# Patient Record
Sex: Female | Born: 1945 | Race: White | Hispanic: No | Smoking: Never smoker
Health system: Southern US, Community
[De-identification: ages and names within clinical notes are randomized; demographics above are authoritative.]

## PROBLEM LIST (undated history)

## (undated) DIAGNOSIS — E119 Type 2 diabetes mellitus without complications: Secondary | ICD-10-CM

## (undated) DIAGNOSIS — R739 Hyperglycemia, unspecified: Secondary | ICD-10-CM

## (undated) DIAGNOSIS — I10 Essential (primary) hypertension: Secondary | ICD-10-CM

## (undated) DIAGNOSIS — N390 Urinary tract infection, site not specified: Secondary | ICD-10-CM

## (undated) HISTORY — PX: CHOLECYSTECTOMY: SHX55

## (undated) HISTORY — PX: ABDOMINAL HYSTERECTOMY: SHX81

---

## 1997-03-23 ENCOUNTER — Ambulatory Visit (HOSPITAL_COMMUNITY): Admission: RE | Admit: 1997-03-23 | Discharge: 1997-03-23 | Payer: Self-pay | Admitting: Obstetrics and Gynecology

## 1997-07-10 ENCOUNTER — Other Ambulatory Visit: Admission: RE | Admit: 1997-07-10 | Discharge: 1997-07-10 | Payer: Self-pay | Admitting: Obstetrics and Gynecology

## 1998-06-07 ENCOUNTER — Ambulatory Visit (HOSPITAL_COMMUNITY): Admission: RE | Admit: 1998-06-07 | Discharge: 1998-06-07 | Payer: Self-pay | Admitting: Obstetrics and Gynecology

## 1998-06-07 ENCOUNTER — Encounter: Payer: Self-pay | Admitting: Obstetrics and Gynecology

## 1998-06-07 ENCOUNTER — Other Ambulatory Visit: Admission: RE | Admit: 1998-06-07 | Discharge: 1998-06-07 | Payer: Self-pay | Admitting: Obstetrics and Gynecology

## 1999-07-27 ENCOUNTER — Ambulatory Visit (HOSPITAL_COMMUNITY): Admission: RE | Admit: 1999-07-27 | Discharge: 1999-07-27 | Payer: Self-pay | Admitting: Obstetrics and Gynecology

## 1999-07-27 ENCOUNTER — Encounter: Payer: Self-pay | Admitting: Obstetrics and Gynecology

## 1999-07-28 ENCOUNTER — Other Ambulatory Visit: Admission: RE | Admit: 1999-07-28 | Discharge: 1999-07-28 | Payer: Self-pay | Admitting: *Deleted

## 2000-01-23 ENCOUNTER — Other Ambulatory Visit: Admission: RE | Admit: 2000-01-23 | Discharge: 2000-01-23 | Payer: Self-pay | Admitting: Obstetrics and Gynecology

## 2016-01-02 ENCOUNTER — Encounter (HOSPITAL_COMMUNITY): Payer: Self-pay | Admitting: Emergency Medicine

## 2016-01-02 ENCOUNTER — Inpatient Hospital Stay (HOSPITAL_COMMUNITY)
Admission: EM | Admit: 2016-01-02 | Discharge: 2016-01-09 | DRG: 690 | Disposition: A | Discharge status: Skilled Nursing Facility | Payer: Medicare Other | Attending: Internal Medicine | Admitting: Internal Medicine

## 2016-01-02 DIAGNOSIS — E1022 Type 1 diabetes mellitus with diabetic chronic kidney disease: Secondary | ICD-10-CM

## 2016-01-02 DIAGNOSIS — N390 Urinary tract infection, site not specified: Secondary | ICD-10-CM | POA: Diagnosis present

## 2016-01-02 DIAGNOSIS — E1122 Type 2 diabetes mellitus with diabetic chronic kidney disease: Secondary | ICD-10-CM | POA: Diagnosis present

## 2016-01-02 DIAGNOSIS — R079 Chest pain, unspecified: Secondary | ICD-10-CM

## 2016-01-02 DIAGNOSIS — E860 Dehydration: Secondary | ICD-10-CM | POA: Diagnosis present

## 2016-01-02 DIAGNOSIS — N189 Chronic kidney disease, unspecified: Secondary | ICD-10-CM | POA: Diagnosis present

## 2016-01-02 DIAGNOSIS — Z9049 Acquired absence of other specified parts of digestive tract: Secondary | ICD-10-CM

## 2016-01-02 DIAGNOSIS — R000 Tachycardia, unspecified: Secondary | ICD-10-CM

## 2016-01-02 DIAGNOSIS — M6282 Rhabdomyolysis: Secondary | ICD-10-CM | POA: Diagnosis present

## 2016-01-02 DIAGNOSIS — Z794 Long term (current) use of insulin: Secondary | ICD-10-CM

## 2016-01-02 DIAGNOSIS — Z7984 Long term (current) use of oral hypoglycemic drugs: Secondary | ICD-10-CM

## 2016-01-02 DIAGNOSIS — Z880 Allergy status to penicillin: Secondary | ICD-10-CM

## 2016-01-02 DIAGNOSIS — R197 Diarrhea, unspecified: Secondary | ICD-10-CM

## 2016-01-02 DIAGNOSIS — R778 Other specified abnormalities of plasma proteins: Secondary | ICD-10-CM

## 2016-01-02 DIAGNOSIS — E042 Nontoxic multinodular goiter: Secondary | ICD-10-CM | POA: Diagnosis present

## 2016-01-02 DIAGNOSIS — I129 Hypertensive chronic kidney disease with stage 1 through stage 4 chronic kidney disease, or unspecified chronic kidney disease: Secondary | ICD-10-CM | POA: Diagnosis present

## 2016-01-02 DIAGNOSIS — D649 Anemia, unspecified: Secondary | ICD-10-CM | POA: Diagnosis present

## 2016-01-02 DIAGNOSIS — R140 Abdominal distension (gaseous): Secondary | ICD-10-CM | POA: Diagnosis present

## 2016-01-02 DIAGNOSIS — R531 Weakness: Secondary | ICD-10-CM

## 2016-01-02 DIAGNOSIS — N179 Acute kidney failure, unspecified: Secondary | ICD-10-CM | POA: Diagnosis present

## 2016-01-02 DIAGNOSIS — R443 Hallucinations, unspecified: Secondary | ICD-10-CM | POA: Diagnosis present

## 2016-01-02 DIAGNOSIS — R7989 Other specified abnormal findings of blood chemistry: Secondary | ICD-10-CM

## 2016-01-02 DIAGNOSIS — Z79891 Long term (current) use of opiate analgesic: Secondary | ICD-10-CM

## 2016-01-02 DIAGNOSIS — I10 Essential (primary) hypertension: Secondary | ICD-10-CM

## 2016-01-02 DIAGNOSIS — I2510 Atherosclerotic heart disease of native coronary artery without angina pectoris: Secondary | ICD-10-CM | POA: Diagnosis present

## 2016-01-02 DIAGNOSIS — E1165 Type 2 diabetes mellitus with hyperglycemia: Secondary | ICD-10-CM

## 2016-01-02 HISTORY — DX: Type 2 diabetes mellitus without complications: E11.9

## 2016-01-02 HISTORY — DX: Essential (primary) hypertension: I10

## 2016-01-02 HISTORY — DX: Hyperglycemia, unspecified: R73.9

## 2016-01-02 HISTORY — DX: Urinary tract infection, site not specified: N39.0

## 2016-01-02 LAB — COMPREHENSIVE METABOLIC PANEL
ALBUMIN: 4 g/dL (ref 3.5–5.0)
ALT: 69 U/L — AB (ref 14–54)
AST: 61 U/L — AB (ref 15–41)
Alkaline Phosphatase: 59 U/L (ref 38–126)
Anion gap: 14 (ref 5–15)
BILIRUBIN TOTAL: 0.8 mg/dL (ref 0.3–1.2)
BUN: 23 mg/dL — AB (ref 6–20)
CHLORIDE: 99 mmol/L — AB (ref 101–111)
CO2: 24 mmol/L (ref 22–32)
CREATININE: 1.13 mg/dL — AB (ref 0.44–1.00)
Calcium: 9.5 mg/dL (ref 8.9–10.3)
GFR calc Af Amer: 56 mL/min — ABNORMAL LOW (ref >60)
GFR, EST NON AFRICAN AMERICAN: 48 mL/min — AB (ref >60)
GLUCOSE: 422 mg/dL — AB (ref 65–99)
POTASSIUM: 4 mmol/L (ref 3.5–5.1)
Sodium: 137 mmol/L (ref 135–145)
TOTAL PROTEIN: 8.1 g/dL (ref 6.5–8.1)

## 2016-01-02 LAB — URINALYSIS, ROUTINE W REFLEX MICROSCOPIC
BILIRUBIN URINE: NEGATIVE
KETONES UR: 15 mg/dL — AB
Nitrite: POSITIVE — AB
PH: 5 (ref 5.0–8.0)
PROTEIN: 30 mg/dL — AB
Specific Gravity, Urine: 1.031 — ABNORMAL HIGH (ref 1.005–1.030)

## 2016-01-02 LAB — CBC
HEMATOCRIT: 39 % (ref 36.0–46.0)
Hemoglobin: 12.9 g/dL (ref 12.0–15.0)
MCH: 27 pg (ref 26.0–34.0)
MCHC: 33.1 g/dL (ref 30.0–36.0)
MCV: 81.8 fL (ref 78.0–100.0)
PLATELETS: 283 10*3/uL (ref 150–400)
RBC: 4.77 MIL/uL (ref 3.87–5.11)
RDW: 15.4 % (ref 11.5–15.5)
WBC: 19.9 10*3/uL — AB (ref 4.0–10.5)

## 2016-01-02 LAB — URINE MICROSCOPIC-ADD ON: RBC / HPF: NONE SEEN RBC/hpf (ref 0–5)

## 2016-01-02 LAB — LIPASE, BLOOD: Lipase: 21 U/L (ref 11–51)

## 2016-01-02 LAB — CBG MONITORING, ED
GLUCOSE-CAPILLARY: 309 mg/dL — AB (ref 65–99)
Glucose-Capillary: 393 mg/dL — ABNORMAL HIGH (ref 65–99)

## 2016-01-02 MED ORDER — INSULIN GLARGINE 100 UNIT/ML ~~LOC~~ SOLN
50.0000 [IU] | Freq: Once | SUBCUTANEOUS | Status: AC
  Administered 2016-01-03: 50 [IU] via SUBCUTANEOUS
  Filled 2016-01-02 (×2): qty 0.5

## 2016-01-02 MED ORDER — SODIUM CHLORIDE 0.9 % IV BOLUS (SEPSIS)
1000.0000 mL | Freq: Once | INTRAVENOUS | Status: AC
  Administered 2016-01-02: 1000 mL via INTRAVENOUS

## 2016-01-02 MED ORDER — DEXTROSE 5 % IV SOLN
1.0000 g | Freq: Once | INTRAVENOUS | Status: AC
  Administered 2016-01-02: 1 g via INTRAVENOUS
  Filled 2016-01-02: qty 10

## 2016-01-02 MED ORDER — SODIUM CHLORIDE 0.9 % IV SOLN: Freq: Once | INTRAVENOUS | Status: DC

## 2016-01-02 NOTE — ED Triage Notes (Signed)
PT states she is staying with her daughter for a few days because her daughter has someone coming to her house to do work and wanted someone there with them.  Pt states she normally takes a shower but wanted to take a bath last night.  States she was stuck in bathtub for several hours once she let water out and didn't have the strength to get up because the positioning of the bathtub didn't allow her stronger side so she could get up.  States daughter had to help her get out of bathtub.  Today pt was unable to get off commode in same bathroom because of positioning.  States she was upset trying to get up and had episode of diarrhea that got all over her.  States once she was trying to get cleaned up she had another episode of bowel incontinence.  Pt reports generalized weakness.  Denies abd pain.

## 2016-01-02 NOTE — ED Notes (Signed)
Placed on  bedpan

## 2016-01-02 NOTE — ED Provider Notes (Signed)
[facility]-EMERGENCY DEPT Provider Note   CSN: [id number] Arrival date & time: 01/02/16  1705     History   Chief Complaint Chief Complaint  Patient presents with  . Diarrhea    HPI Anne Cannon is a 70 y.o. female.  Patient with history of uncontrolled diabetes, high blood pressure, cholecystectomy presents with general weakness and diarrhea. Since yesterday patient had recurrent diarrhea and worsening general weakness. Normally patient can get around with a cane however family went to pick her up for Thanksgiving break and they could not even lift her. Patient has no fever or abdominal pain. Mild cramping. No recent antibody X Spear patient vague with medical history. Patient coming down from [place]. No local physician.      Past Medical History:  Diagnosis Date  . Diabetes mellitus without complication (HCC)   . Hypertension     Patient Active Problem List   Diagnosis Date Noted  . UTI (urinary tract infection) 01/02/2016    Past Surgical History:  Procedure Laterality Date  . ABDOMINAL HYSTERECTOMY    . CHOLECYSTECTOMY      OB History    No data available       Home Medications    Prior to Admission medications   Medication Sig Start Date End Date Taking? Authorizing Provider  gabapentin (NEURONTIN) 100 MG capsule Take 100 mg by mouth at bedtime. 11/01/15  Yes Historical Provider, MD  LANTUS 100 UNIT/ML injection Inject 50 Units into the skin 2 (two) times daily. 12/29/15  Yes Historical Provider, MD  lisinopril (PRINIVIL,ZESTRIL) 10 MG tablet Take 10 mg by mouth daily. 12/02/15  Yes Historical Provider, MD  loratadine (CLARITIN) 10 MG tablet Take 10 mg by mouth daily.   Yes Historical Provider, MD  metFORMIN (GLUCOPHAGE) 1000 MG tablet Take 1,000 mg by mouth 2 (two) times daily. 12/22/15  Yes Historical Provider, MD  metoprolol succinate (TOPROL-XL) 100 MG 24 hr tablet Take 100 mg by mouth at bedtime.  12/02/15  Yes Historical Provider, MD    Family  History No family history on file.  Social History Social History  Substance Use Topics  . Smoking status: Never Smoker  . Smokeless tobacco: Never Used  . Alcohol use No     Allergies   Acetaminophen and Penicillins   Review of Systems Review of Systems  Constitutional: Positive for fatigue. Negative for chills and fever.  HENT: Negative for ear pain and sore throat.   Eyes: Negative for pain and visual disturbance.  Respiratory: Negative for cough and shortness of breath.   Cardiovascular: Negative for chest pain and palpitations.  Gastrointestinal: Positive for diarrhea and nausea. Negative for abdominal pain and vomiting.  Genitourinary: Negative for dysuria and hematuria.  Musculoskeletal: Negative for arthralgias and back pain.  Skin: Negative for color change and rash.  Neurological: Positive for weakness. Negative for seizures and syncope.  Psychiatric/Behavioral: Positive for hallucinations.  All other systems reviewed and are negative.    Physical Exam Updated Vital Signs BP 147/75   Pulse 113   Temp 98.2 F (36.8 C) (Oral)   Resp 21   Ht 5\' 4"  (1.626 m)   Wt 198 lb (89.8 kg)   SpO2 95%   BMI 33.99 kg/m   Physical Exam  Constitutional: She appears well-developed and well-nourished. No distress.  HENT:  Head: Normocephalic and atraumatic.  Dry mucous membranes  Eyes: Conjunctivae are normal.  Neck: Neck supple.  Cardiovascular: Regular rhythm.  Tachycardia present.   No murmur heard. Pulmonary/Chest: Effort normal  and breath sounds normal. No respiratory distress.  Abdominal: Soft. There is no tenderness.  Musculoskeletal: She exhibits no edema.  Neurological: She is alert.  Skin: Skin is warm and dry.  Psychiatric: She has a normal mood and affect.  Nursing note and vitals reviewed.    ED Treatments / Results  Labs (all labs ordered are listed, but only abnormal results are displayed) Labs Reviewed  COMPREHENSIVE METABOLIC PANEL -  Abnormal; Notable for the following:       Result Value   Chloride 99 (*)    Glucose, Bld 422 (*)    BUN 23 (*)    Creatinine, Ser 1.13 (*)    AST 61 (*)    ALT 69 (*)    GFR calc non Af Amer 48 (*)    GFR calc Af Amer 56 (*)    All other components within normal limits  CBC - Abnormal; Notable for the following:    WBC 19.9 (*)    All other components within normal limits  URINALYSIS, ROUTINE W REFLEX MICROSCOPIC (NOT AT [facility]) - Abnormal; Notable for the following:    APPearance CLOUDY (*)    Specific Gravity, Urine 1.031 (*)    Glucose, UA >1000 (*)    Hgb urine dipstick SMALL (*)    Ketones, ur 15 (*)    Protein, ur 30 (*)    Nitrite POSITIVE (*)    Leukocytes, UA SMALL (*)    All other components within normal limits  URINE MICROSCOPIC-ADD ON - Abnormal; Notable for the following:    Squamous Epithelial / LPF 6-30 (*)    Bacteria, UA MANY (*)    All other components within normal limits  CBG MONITORING, ED - Abnormal; Notable for the following:    Glucose-Capillary 393 (*)    All other components within normal limits  LIPASE, BLOOD    EKG  EKG Interpretation None       Radiology No results found.  Procedures Procedures (including critical care time)  Medications Ordered in ED Medications  insulin glargine (LANTUS) injection 50 Units (not administered)  0.9 %  sodium chloride infusion (not administered)  sodium chloride 0.9 % bolus 1,000 mL (0 mLs Intravenous Stopped 01/02/16 2030)  cefTRIAXone (ROCEPHIN) 1 g in dextrose 5 % 50 mL IVPB (0 g Intravenous Stopped 01/02/16 2147)     Initial Impression / Assessment and Plan / ED Course  I have reviewed the triage vital signs and the nursing notes.  Pertinent labs & imaging results that were available during my care of the patient were reviewed by me and considered in my medical decision making (see chart for details).  Clinical Course    Patient presents with clinical concern for dehydration, uncontrolled  diabetes which is led to a general weakness. Patient does have leukocytosis and is had mild hallucinations. Plan for urinalysis and admission. IV fluids ordered  The patients results and plan were reviewed and discussed.   Any x-rays performed were independently reviewed by myself.   Differential diagnosis were considered with the presenting HPI.  Medications  insulin glargine (LANTUS) injection 50 Units (not administered)  0.9 %  sodium chloride infusion (not administered)  sodium chloride 0.9 % bolus 1,000 mL (0 mLs Intravenous Stopped 01/02/16 2030)  cefTRIAXone (ROCEPHIN) 1 g in dextrose 5 % 50 mL IVPB (0 g Intravenous Stopped 01/02/16 2147)    Vitals:   01/02/16 2000 01/02/16 2045 01/02/16 2115 01/02/16 2145  BP: 141/73 158/95 169/67 147/75  Pulse: 115  116 115 113  Resp: 19 (!) 27 22 21   Temp:      TempSrc:      SpO2: 97% 96% 95% 95%  Weight:      Height:        Final diagnoses:  General weakness  Diarrhea, unspecified type  Dehydration  Urinary tract infection without hematuria, site unspecified    Admission/ observation were discussed with the admitting physician, patient and/or family and they are comfortable with the plan.    Final Clinical Impressions(s) / ED Diagnoses   Final diagnoses:  General weakness  Diarrhea, unspecified type  Dehydration  Urinary tract infection without hematuria, site unspecified    New Prescriptions New Prescriptions   No medications on file     Blane OharaJoshua Keena Dinse, MD 01/02/16 2231

## 2016-01-02 NOTE — ED Notes (Signed)
Report attempted 

## 2016-01-03 ENCOUNTER — Encounter (HOSPITAL_COMMUNITY): Payer: Self-pay | Admitting: Internal Medicine

## 2016-01-03 ENCOUNTER — Observation Stay (HOSPITAL_COMMUNITY): Payer: Medicare Other

## 2016-01-03 DIAGNOSIS — Z9049 Acquired absence of other specified parts of digestive tract: Secondary | ICD-10-CM | POA: Diagnosis not present

## 2016-01-03 DIAGNOSIS — N3000 Acute cystitis without hematuria: Secondary | ICD-10-CM | POA: Diagnosis not present

## 2016-01-03 DIAGNOSIS — R739 Hyperglycemia, unspecified: Secondary | ICD-10-CM

## 2016-01-03 DIAGNOSIS — E1165 Type 2 diabetes mellitus with hyperglycemia: Secondary | ICD-10-CM | POA: Diagnosis present

## 2016-01-03 DIAGNOSIS — M6282 Rhabdomyolysis: Secondary | ICD-10-CM | POA: Diagnosis present

## 2016-01-03 DIAGNOSIS — R531 Weakness: Secondary | ICD-10-CM | POA: Diagnosis not present

## 2016-01-03 DIAGNOSIS — R748 Abnormal levels of other serum enzymes: Secondary | ICD-10-CM | POA: Diagnosis not present

## 2016-01-03 DIAGNOSIS — R079 Chest pain, unspecified: Secondary | ICD-10-CM | POA: Diagnosis not present

## 2016-01-03 DIAGNOSIS — N390 Urinary tract infection, site not specified: Secondary | ICD-10-CM

## 2016-01-03 DIAGNOSIS — Z794 Long term (current) use of insulin: Secondary | ICD-10-CM | POA: Diagnosis not present

## 2016-01-03 DIAGNOSIS — I10 Essential (primary) hypertension: Secondary | ICD-10-CM | POA: Diagnosis not present

## 2016-01-03 DIAGNOSIS — E042 Nontoxic multinodular goiter: Secondary | ICD-10-CM | POA: Diagnosis present

## 2016-01-03 DIAGNOSIS — I2510 Atherosclerotic heart disease of native coronary artery without angina pectoris: Secondary | ICD-10-CM | POA: Diagnosis present

## 2016-01-03 DIAGNOSIS — Z7984 Long term (current) use of oral hypoglycemic drugs: Secondary | ICD-10-CM | POA: Diagnosis not present

## 2016-01-03 DIAGNOSIS — R197 Diarrhea, unspecified: Secondary | ICD-10-CM | POA: Diagnosis not present

## 2016-01-03 DIAGNOSIS — N189 Chronic kidney disease, unspecified: Secondary | ICD-10-CM | POA: Diagnosis present

## 2016-01-03 DIAGNOSIS — R140 Abdominal distension (gaseous): Secondary | ICD-10-CM | POA: Diagnosis present

## 2016-01-03 DIAGNOSIS — E860 Dehydration: Secondary | ICD-10-CM | POA: Diagnosis present

## 2016-01-03 DIAGNOSIS — R443 Hallucinations, unspecified: Secondary | ICD-10-CM | POA: Diagnosis present

## 2016-01-03 DIAGNOSIS — D649 Anemia, unspecified: Secondary | ICD-10-CM | POA: Diagnosis present

## 2016-01-03 DIAGNOSIS — N179 Acute kidney failure, unspecified: Secondary | ICD-10-CM | POA: Diagnosis present

## 2016-01-03 DIAGNOSIS — E1122 Type 2 diabetes mellitus with diabetic chronic kidney disease: Secondary | ICD-10-CM | POA: Diagnosis present

## 2016-01-03 DIAGNOSIS — I129 Hypertensive chronic kidney disease with stage 1 through stage 4 chronic kidney disease, or unspecified chronic kidney disease: Secondary | ICD-10-CM | POA: Diagnosis present

## 2016-01-03 DIAGNOSIS — Z79891 Long term (current) use of opiate analgesic: Secondary | ICD-10-CM | POA: Diagnosis not present

## 2016-01-03 DIAGNOSIS — E1022 Type 1 diabetes mellitus with diabetic chronic kidney disease: Secondary | ICD-10-CM | POA: Diagnosis not present

## 2016-01-03 DIAGNOSIS — Z880 Allergy status to penicillin: Secondary | ICD-10-CM | POA: Diagnosis not present

## 2016-01-03 HISTORY — DX: Urinary tract infection, site not specified: N39.0

## 2016-01-03 HISTORY — DX: Hyperglycemia, unspecified: R73.9

## 2016-01-03 LAB — CBC WITH DIFFERENTIAL/PLATELET
Basophils Absolute: 0 10*3/uL (ref 0.0–0.1)
Basophils Relative: 0 %
Eosinophils Absolute: 0.1 10*3/uL (ref 0.0–0.7)
Eosinophils Relative: 1 %
HEMATOCRIT: 34.6 % — AB (ref 36.0–46.0)
HEMOGLOBIN: 11 g/dL — AB (ref 12.0–15.0)
LYMPHS ABS: 3.7 10*3/uL (ref 0.7–4.0)
LYMPHS PCT: 23 %
MCH: 26.2 pg (ref 26.0–34.0)
MCHC: 31.8 g/dL (ref 30.0–36.0)
MCV: 82.4 fL (ref 78.0–100.0)
MONO ABS: 1.4 10*3/uL — AB (ref 0.1–1.0)
MONOS PCT: 9 %
NEUTROS ABS: 10.9 10*3/uL — AB (ref 1.7–7.7)
Neutrophils Relative %: 67 %
Platelets: 217 10*3/uL (ref 150–400)
RBC: 4.2 MIL/uL (ref 3.87–5.11)
RDW: 15.7 % — AB (ref 11.5–15.5)
WBC: 16.1 10*3/uL — ABNORMAL HIGH (ref 4.0–10.5)

## 2016-01-03 LAB — COMPREHENSIVE METABOLIC PANEL
ALBUMIN: 3.2 g/dL — AB (ref 3.5–5.0)
ALK PHOS: 47 U/L (ref 38–126)
ALT: 50 U/L (ref 14–54)
ANION GAP: 10 (ref 5–15)
AST: 47 U/L — ABNORMAL HIGH (ref 15–41)
BUN: 16 mg/dL (ref 6–20)
CHLORIDE: 103 mmol/L (ref 101–111)
CO2: 23 mmol/L (ref 22–32)
Calcium: 8.4 mg/dL — ABNORMAL LOW (ref 8.9–10.3)
Creatinine, Ser: 0.89 mg/dL (ref 0.44–1.00)
GFR calc Af Amer: >60 mL/min (ref >60)
GFR calc non Af Amer: >60 mL/min (ref >60)
GLUCOSE: 349 mg/dL — AB (ref 65–99)
POTASSIUM: 3.6 mmol/L (ref 3.5–5.1)
SODIUM: 136 mmol/L (ref 135–145)
Total Bilirubin: 0.6 mg/dL (ref 0.3–1.2)
Total Protein: 6.1 g/dL — ABNORMAL LOW (ref 6.5–8.1)

## 2016-01-03 LAB — GASTROINTESTINAL PANEL BY PCR, STOOL (REPLACES STOOL CULTURE)
Adenovirus F40/41: NOT DETECTED
Astrovirus: NOT DETECTED
Campylobacter species: NOT DETECTED
Cryptosporidium: NOT DETECTED
Cyclospora cayetanensis: NOT DETECTED
ENTEROAGGREGATIVE E COLI (EAEC): NOT DETECTED
ENTEROPATHOGENIC E COLI (EPEC): NOT DETECTED
ENTEROTOXIGENIC E COLI (ETEC): NOT DETECTED
Entamoeba histolytica: NOT DETECTED
GIARDIA LAMBLIA: NOT DETECTED
NOROVIRUS GI/GII: NOT DETECTED
Plesimonas shigelloides: NOT DETECTED
ROTAVIRUS A: NOT DETECTED
SALMONELLA SPECIES: NOT DETECTED
SHIGELLA/ENTEROINVASIVE E COLI (EIEC): NOT DETECTED
Sapovirus (I, II, IV, and V): NOT DETECTED
Shiga like toxin producing E coli (STEC): NOT DETECTED
Vibrio cholerae: NOT DETECTED
Vibrio species: NOT DETECTED
Yersinia enterocolitica: NOT DETECTED

## 2016-01-03 LAB — TROPONIN I
TROPONIN I: 0.1 ng/mL — AB (ref <0.03)
TROPONIN I: 0.05 ng/mL — AB (ref <0.03)
Troponin I: 0.09 ng/mL (ref <0.03)

## 2016-01-03 LAB — D-DIMER, QUANTITATIVE (NOT AT ARMC): D DIMER QUANT: 1.38 ug{FEU}/mL — AB (ref 0.00–0.50)

## 2016-01-03 LAB — GLUCOSE, CAPILLARY
GLUCOSE-CAPILLARY: 171 mg/dL — AB (ref 65–99)
GLUCOSE-CAPILLARY: 175 mg/dL — AB (ref 65–99)
Glucose-Capillary: 377 mg/dL — ABNORMAL HIGH (ref 65–99)
Glucose-Capillary: 308 mg/dL — ABNORMAL HIGH (ref 65–99)

## 2016-01-03 LAB — CK: Total CK: 1154 U/L — ABNORMAL HIGH (ref 38–234)

## 2016-01-03 MED ORDER — GABAPENTIN 100 MG PO CAPS
100.0000 mg | ORAL_CAPSULE | Freq: Every day | ORAL | Status: DC
  Administered 2016-01-03 – 2016-01-08 (×7): 100 mg via ORAL
  Filled 2016-01-03 (×7): qty 1

## 2016-01-03 MED ORDER — WHITE PETROLATUM GEL
Status: AC
  Administered 2016-01-03: 01:00:00
  Filled 2016-01-03: qty 1

## 2016-01-03 MED ORDER — METOPROLOL SUCCINATE ER 100 MG PO TB24
100.0000 mg | ORAL_TABLET | Freq: Every day | ORAL | Status: DC
  Administered 2016-01-03 – 2016-01-08 (×7): 100 mg via ORAL
  Filled 2016-01-03 (×7): qty 1

## 2016-01-03 MED ORDER — LORATADINE 10 MG PO TABS
10.0000 mg | ORAL_TABLET | Freq: Every day | ORAL | Status: DC
  Administered 2016-01-03 – 2016-01-09 (×7): 10 mg via ORAL
  Filled 2016-01-03 (×7): qty 1

## 2016-01-03 MED ORDER — ONDANSETRON HCL 4 MG PO TABS: 4.0000 mg | ORAL_TABLET | Freq: Four times a day (QID) | ORAL | Status: DC | PRN

## 2016-01-03 MED ORDER — ENOXAPARIN SODIUM 40 MG/0.4ML ~~LOC~~ SOLN
40.0000 mg | SUBCUTANEOUS | Status: DC
  Administered 2016-01-03 – 2016-01-09 (×7): 40 mg via SUBCUTANEOUS
  Filled 2016-01-03 (×8): qty 0.4

## 2016-01-03 MED ORDER — INSULIN GLARGINE 100 UNIT/ML ~~LOC~~ SOLN
50.0000 [IU] | Freq: Two times a day (BID) | SUBCUTANEOUS | Status: DC
  Administered 2016-01-04 – 2016-01-09 (×11): 50 [IU] via SUBCUTANEOUS
  Filled 2016-01-03 (×12): qty 0.5

## 2016-01-03 MED ORDER — SODIUM CHLORIDE 0.9 % IV SOLN
INTRAVENOUS | Status: AC
  Administered 2016-01-03: 01:00:00 via INTRAVENOUS

## 2016-01-03 MED ORDER — INSULIN ASPART 100 UNIT/ML ~~LOC~~ SOLN
0.0000 [IU] | Freq: Three times a day (TID) | SUBCUTANEOUS | Status: DC
  Administered 2016-01-03: 11 [IU] via SUBCUTANEOUS
  Administered 2016-01-03: 15 [IU] via SUBCUTANEOUS
  Administered 2016-01-03 – 2016-01-04 (×2): 3 [IU] via SUBCUTANEOUS
  Administered 2016-01-04 – 2016-01-05 (×3): 5 [IU] via SUBCUTANEOUS
  Administered 2016-01-05: 3 [IU] via SUBCUTANEOUS
  Administered 2016-01-06: 5 [IU] via SUBCUTANEOUS
  Administered 2016-01-06: 3 [IU] via SUBCUTANEOUS
  Administered 2016-01-07: 8 [IU] via SUBCUTANEOUS
  Administered 2016-01-07: 3 [IU] via SUBCUTANEOUS
  Administered 2016-01-07 – 2016-01-08 (×2): 8 [IU] via SUBCUTANEOUS
  Administered 2016-01-08: 5 [IU] via SUBCUTANEOUS
  Administered 2016-01-08: 3 [IU] via SUBCUTANEOUS

## 2016-01-03 MED ORDER — ONDANSETRON HCL 4 MG/2ML IJ SOLN: 4.0000 mg | Freq: Four times a day (QID) | INTRAMUSCULAR | Status: DC | PRN

## 2016-01-03 MED ORDER — IOPAMIDOL (ISOVUE-370) INJECTION 76%
INTRAVENOUS | Status: AC
  Administered 2016-01-03: 80 mL
  Filled 2016-01-03: qty 100

## 2016-01-03 MED ORDER — DEXTROSE 5 % IV SOLN
1.0000 g | INTRAVENOUS | Status: DC
  Administered 2016-01-03 – 2016-01-04 (×2): 1 g via INTRAVENOUS
  Filled 2016-01-03 (×3): qty 10

## 2016-01-03 MED ORDER — HYDRALAZINE HCL 20 MG/ML IJ SOLN
10.0000 mg | INTRAMUSCULAR | Status: DC | PRN
  Administered 2016-01-07: 10 mg via INTRAVENOUS
  Filled 2016-01-03 (×2): qty 1

## 2016-01-03 NOTE — Progress Notes (Signed)
Inpatient Diabetes Program Recommendations  AACE/ADA: New Consensus Statement on Inpatient Glycemic Control (2015)  Target Ranges:  Prepandial:   less than 140 mg/dL      Peak postprandial:   less than 180 mg/dL (1-2 hours)      Critically ill patients:  140 - 180 mg/dL   Lab Results  Component Value Date   GLUCAP 308 (H) 01/03/2016    Review of Glycemic Control:  Results for Anne Cannon, Anne Cannon (MRN [id number]) as of 01/03/2016 14:28  Ref. Range 01/02/2016 17:27 01/03/2016 07:07  Glucose Latest Ref Range: 65 - 99 mg/dL 147422 (H) 829349 (H)    Diabetes history: Type 2 diabetes  Outpatient Diabetes medications: Lantus 50 units bid Current orders for Inpatient glycemic control: Lantus 50 units bid, Novolog moderate tid with meals and HS  Inpatient Diabetes Program Recommendations:   Please check A1C to determine pre-hospitalization glycemic control.    Thanks, Beryl MeagerJenny Gamal Todisco, RN, BC-ADM Inpatient Diabetes Coordinator Pager [phone] (8a-5p)

## 2016-01-03 NOTE — Progress Notes (Signed)
Pharmacy Antibiotic Note  Anne HusbandsLinda Cannon is a 70 y.o. female admitted on 01/02/2016 with UTI.  Pharmacy has been consulted for Rocephin dosing.  Plan: Rocephin 1g IV Q24H.  Pharmacy will sign off.  Height: 5\' 4"  (162.6 cm) Weight: 190 lb 3.2 oz (86.3 kg) IBW/kg (Calculated) : 54.7  Temp (24hrs), Avg:98.3 F (36.8 C), Min:98.2 F (36.8 C), Max:98.3 F (36.8 C)   Recent Labs Lab 01/02/16 1727  WBC 19.9*  CREATININE 1.13*    Estimated Creatinine Clearance: 49.2 mL/min (by C-G formula based on SCr of 1.13 mg/dL (H)).    Allergies  Allergen Reactions  . Acetaminophen Other (See Comments)    epitaxis   . Penicillins Other (See Comments)    Stomach problems     Thank you for allowing pharmacy to be a part of this patient's care.  Vernard GamblesVeronda Rosalie Cannon, PharmD, BCPS  01/03/2016 12:18 AM

## 2016-01-03 NOTE — Consult Note (Signed)
CARDIOLOGY CONSULT NOTE  Patient ID: Anne Cannon MRN: [id number] DOB/AGE: June 10, 1945 70 y.o.  Admit date: 01/02/2016 Primary Physician :  None Primary Cardiologist New Chief Complaint  Elevated troponin Requesting  Dr. Caleb PoppNettey  HPI:   She was admitted with an episode of weakness. She seems somewhat confused.  She was staying with her daughter.  She tried to take a bath and could not get out of the tub.  She was apparently in the tub for hours and her daughter could not get her out and she did not want them to call 911.  On admission she was found to have an elevated CK.  Troponin has been mildly increased.  D dimer was negative but there was no evidence of PE on CT.    She is found to have a UTI. She reports a baseline weakness and falls.  She denies any chest pain as far as I can tell.  She seems to be somewhat limited and walks with a cane because of leg weakness.  The patient denies any new symptoms such as chest discomfort, neck or arm discomfort. There has been no new shortness of breath, PND or orthopnea. There have been no reported palpitations, presyncope or syncope.   Past Medical History:  Diagnosis Date  . Diabetes mellitus without complication (HCC)   . Hypertension     Past Surgical History:  Procedure Laterality Date  . ABDOMINAL HYSTERECTOMY    . CHOLECYSTECTOMY      Allergies  Allergen Reactions  . Acetaminophen Other (See Comments)    epitaxis   . Penicillins Other (See Comments)    Stomach problems   Prescriptions Prior to Admission  Medication Sig Dispense Refill Last Dose  . gabapentin (NEURONTIN) 100 MG capsule Take 100 mg by mouth at bedtime.   01/01/2016 at Unknown time  . LANTUS 100 UNIT/ML injection Inject 50 Units into the skin 2 (two) times daily.   01/01/2016 at Unknown time  . lisinopril (PRINIVIL,ZESTRIL) 10 MG tablet Take 10 mg by mouth daily.   01/01/2016 at Unknown time  . loratadine (CLARITIN) 10 MG tablet Take 10 mg by mouth daily.    01/01/2016 at Unknown time  . metFORMIN (GLUCOPHAGE) 1000 MG tablet Take 1,000 mg by mouth 2 (two) times daily.   01/02/2016 at Unknown time  . metoprolol succinate (TOPROL-XL) 100 MG 24 hr tablet Take 100 mg by mouth at bedtime.    01/01/2016 at 2100   Family History  Problem Relation Age of Onset  . Diabetes Daughter   . Heart disease Mother     No details  . Melanoma Father     Social History   Social History  . Marital status: Widowed    Spouse name: N/A  . Number of children: 2  . Years of education: N/A   Occupational History  . Not on file.   Social History Main Topics  . Smoking status: Never Smoker  . Smokeless tobacco: Never Used  . Alcohol use No  . Drug use: No  . Sexual activity: Not on file   Other Topics Concern  . Not on file   Social History Narrative  . No narrative on file     ROS:  She reports multiple falls, urinary urgency, fatigue.  Otherwise as stated in the HPI and negative for all other systems.  Physical Exam: Blood pressure (!) 141/61, pulse 86, temperature 98.4 F (36.9 C), temperature source Oral, resp. rate 18, height 5\' 4"  (1.626 m), weight  190 lb 3.2 oz (86.3 kg), SpO2 99 %.  GENERAL:  Frail appearing  HEENT:  Pupils equal round and reactive, fundi not visualized, oral mucosa unremarkable NECK:  No jugular venous distention, waveform within normal limits, carotid upstroke brisk and symmetric, no bruits, no thyromegaly LYMPHATICS:  No cervical, inguinal adenopathy LUNGS:  Clear to auscultation bilaterally BACK:  No CVA tenderness CHEST:  Unremarkable HEART:  PMI not displaced or sustained,S1 and S2 within normal limits, no S3, no S4, no clicks, no rubs, no murmurs ABD:  Flat, positive bowel sounds normal in frequency in pitch, no bruits, no rebound, no guarding, no midline pulsatile mass, no hepatomegaly, no splenomegaly EXT:  2 plus pulses throughout, no edema, no cyanosis no clubbing SKIN:  No rashes no nodules, bruises and knee  abrasions.  NEURO:  Cranial nerves II through XII grossly intact, motor grossly intact throughout PSYCH:   Confused    Labs: Lab Results  Component Value Date   BUN 16 01/03/2016   Lab Results  Component Value Date   CREATININE 0.89 01/03/2016   Lab Results  Component Value Date   NA 136 01/03/2016   K 3.6 01/03/2016   CL 103 01/03/2016   CO2 23 01/03/2016   Lab Results  Component Value Date   TROPONINI 0.05 (HH) 01/03/2016   Lab Results  Component Value Date   WBC 16.1 (H) 01/03/2016   HGB 11.0 (L) 01/03/2016   HCT 34.6 (L) 01/03/2016   MCV 82.4 01/03/2016   PLT 217 01/03/2016   No results found for: CHOL, HDL, LDLCALC, LDLDIRECT, TRIG, CHOLHDL Lab Results  Component Value Date   ALT 50 01/03/2016   AST 47 (H) 01/03/2016   ALKPHOS 47 01/03/2016   BILITOT 0.6 01/03/2016      Radiology:     CXR:  Trachea is midline. Heart size is accentuated by low lung volumes. Thoracic aorta is calcified. Lungs are clear. No pleural fluid. Osseous structures are grossly intact.  CT chest :   1.  No evidence of acute pulmonary embolus. 2. Mild atelectasis, no other pulmonary abnormality. 3. Calcified aortic and coronary artery atherosclerosis.  EKG:  Sinus tachycardia, rate 109, axis WNL, no acute ST T wave changes.  01/03/2016  ASSESSMENT AND PLAN:   ELEVATED TROPONIN:     Not sure the clinical context in which this was ordered.  She did have an elevated CK but there was no MB.  She denies any chest pain on my questioning.  She does have cardiovascular risk factors.  She clearly did not have LOC.  There are no acute EKG changes.  At this point I would suggest an echo.  If there are no wall motion abnormalities she could have a [employer] Myoview.  I will follow in the AM.   DIABETES:  I will defer to the primary team but would suggest an A1C.    HTN:  BP is fluctuating.  Continue current therapy.  Increase lisinopril if BP is still elevated.    SignedRollene Rotunda: Manvi Guilliams 01/03/2016, 5:46 PM

## 2016-01-03 NOTE — Progress Notes (Signed)
CRITICAL VALUE ALERT  Critical value received:  Trop 0.09  Date of notification:  01/03/2016   Time of notification:  1:49 AM   Critical value read back:Yes.    Nurse who received alert:  Cresenciano LickMikaela Jerelyn Trimarco   MD notified (1st page):  Toniann FailKakrakandy  Time of first page:  1:50 AM     Responding MD:  Toniann FailKakrakandy  Time MD responded:  1:50 AM

## 2016-01-03 NOTE — H&P (Signed)
History and Physical    Anne HusbandsLinda Cannon ZOX:[id number] DOB: December 16, 1945 DOA: 01/02/2016  PCP: No PCP Per Patient  Patient coming from: Home.  Chief Complaint: Weakness.  HPI: Anne HusbandsLinda Cannon is a 70 y.o. female with hypertension and diabetes mellitus2 had come to visit her daughter's house day before yesterday for Thanksgiving. While taking bath patient found it difficult to get out of the tub. Patient's daughter tried to help but was not able to. After many hours patient's son came and helped her out and was brought to the ER. During the process patient felt very weak and also had some chest discomfort. In the ER patient's labs reveal elevated creatinine and baseline creatinine is not known. UA shows features consistent with UTI. Patient also stated she had two episodes of watery diarrhea. Denies any recent use of antibiotics. Patient is being admitted for further management of her generalized weakness and UTI.   Patient's blood sugar was found to be elevated and patient states she has not received her Lantus dose. Patient also states though recently her blood sugar has been running high and difficult to control.  ED Course: Was started on ceftriaxone for UTI and IV fluids for weakness. Lantus 50 units subcutaneously was given in the ER which is her home dose.  Review of Systems: As per HPI, rest all negative.   Past Medical History:  Diagnosis Date  . Diabetes mellitus without complication (HCC)   . Hypertension     Past Surgical History:  Procedure Laterality Date  . ABDOMINAL HYSTERECTOMY    . CHOLECYSTECTOMY       reports that she has never smoked. She has never used smokeless tobacco. She reports that she does not drink alcohol or use drugs.  Allergies  Allergen Reactions  . Acetaminophen Other (See Comments)    epitaxis   . Penicillins Other (See Comments)    Stomach problems    Family History  Problem Relation Age of Onset  . Diabetes Daughter     Prior to Admission  medications   Medication Sig Start Date End Date Taking? Authorizing Provider  gabapentin (NEURONTIN) 100 MG capsule Take 100 mg by mouth at bedtime. 11/01/15  Yes Historical Provider, MD  LANTUS 100 UNIT/ML injection Inject 50 Units into the skin 2 (two) times daily. 12/29/15  Yes Historical Provider, MD  lisinopril (PRINIVIL,ZESTRIL) 10 MG tablet Take 10 mg by mouth daily. 12/02/15  Yes Historical Provider, MD  loratadine (CLARITIN) 10 MG tablet Take 10 mg by mouth daily.   Yes Historical Provider, MD  metFORMIN (GLUCOPHAGE) 1000 MG tablet Take 1,000 mg by mouth 2 (two) times daily. 12/22/15  Yes Historical Provider, MD  metoprolol succinate (TOPROL-XL) 100 MG 24 hr tablet Take 100 mg by mouth at bedtime.  12/02/15  Yes Historical Provider, MD    Physical Exam: Vitals:   01/02/16 2145 01/02/16 2230 01/02/16 2245 01/02/16 2347  BP: 147/75 156/73 161/74 133/89  Pulse: 113 116 118 (!) 113  Resp: 21 (!) 29 15 18   Temp:    98.3 F (36.8 C)  TempSrc:    Oral  SpO2: 95% 95% 95% 99%  Weight:    86.3 kg (190 lb 3.2 oz)  Height:          Constitutional: Moderately built and nourished. Vitals:   01/02/16 2145 01/02/16 2230 01/02/16 2245 01/02/16 2347  BP: 147/75 156/73 161/74 133/89  Pulse: 113 116 118 (!) 113  Resp: 21 (!) 29 15 18   Temp:  98.3 F (36.8 C)  TempSrc:    Oral  SpO2: 95% 95% 95% 99%  Weight:    86.3 kg (190 lb 3.2 oz)  Height:       Eyes: Anicteric. no pallor. ENMT: No discharge from the ears eyes nose or mouth. Neck: No mass felt. No neck rigidity. Respiratory: No rhonchi or crepitations. Cardiovascular: S1-S2 heard. No murmurs appreciated. Abdomen: Soft nontender bowel sounds present. No guarding or rigidity. Musculoskeletal: No edema. Skin: No rash. Skin appears warm. Neurologic: Alert awake oriented to time place and person. Moves all extremities. Psychiatric: Appears normal. Normal affect.   Labs on Admission: I have personally reviewed following labs and  imaging studies  CBC:  Recent Labs Lab 01/02/16 1727  WBC 19.9*  HGB 12.9  HCT 39.0  MCV 81.8  PLT 283   Basic Metabolic Panel:  Recent Labs Lab 01/02/16 1727  NA 137  K 4.0  CL 99*  CO2 24  GLUCOSE 422*  BUN 23*  CREATININE 1.13*  CALCIUM 9.5   GFR: Estimated Creatinine Clearance: 49.2 mL/min (by C-G formula based on SCr of 1.13 mg/dL (H)). Liver Function Tests:  Recent Labs Lab 01/02/16 1727  AST 61*  ALT 69*  ALKPHOS 59  BILITOT 0.8  PROT 8.1  ALBUMIN 4.0    Recent Labs Lab 01/02/16 1727  LIPASE 21   No results for input(s): AMMONIA in the last 168 hours. Coagulation Profile: No results for input(s): INR, PROTIME in the last 168 hours. Cardiac Enzymes: No results for input(s): CKTOTAL, CKMB, CKMBINDEX, TROPONINI in the last 168 hours. BNP (last 3 results) No results for input(s): PROBNP in the last 8760 hours. HbA1C: No results for input(s): HGBA1C in the last 72 hours. CBG:  Recent Labs Lab 01/02/16 1840 01/02/16 2248  GLUCAP 393* 309*   Lipid Profile: No results for input(s): CHOL, HDL, LDLCALC, TRIG, CHOLHDL, LDLDIRECT in the last 72 hours. Thyroid Function Tests: No results for input(s): TSH, T4TOTAL, FREET4, T3FREE, THYROIDAB in the last 72 hours. Anemia Panel: No results for input(s): VITAMINB12, FOLATE, FERRITIN, TIBC, IRON, RETICCTPCT in the last 72 hours. Urine analysis:    Component Value Date/Time   COLORURINE YELLOW 01/02/2016 1931   APPEARANCEUR CLOUDY (A) 01/02/2016 1931   LABSPEC 1.031 (H) 01/02/2016 1931   PHURINE 5.0 01/02/2016 1931   GLUCOSEU >1000 (A) 01/02/2016 1931   HGBUR SMALL (A) 01/02/2016 1931   BILIRUBINUR NEGATIVE 01/02/2016 1931   KETONESUR 15 (A) 01/02/2016 1931   PROTEINUR 30 (A) 01/02/2016 1931   NITRITE POSITIVE (A) 01/02/2016 1931   LEUKOCYTESUR SMALL (A) 01/02/2016 1931   Sepsis Labs: @LABRCNTIP (procalcitonin:4,lacticidven:4) )No results found for this or any previous visit (from the past 240  hour(s)).   Radiological Exams on Admission: No results found.  EKG: Independently reviewed. Sinus tachycardia with LVH. Nonspecific ST changes in inferior leads.  Assessment/Plan Principal Problem:   General weakness Active Problems:   Urinary tract infection without hematuria   Diarrhea   Dehydration   Controlled type 2 diabetes mellitus with hyperglycemia (HCC)   Weakness    1. Generalized weakness - could be multifactorial including UTI uncontrolled diabetes dehydration and diarrhea. At this time we will gently hydrate. Treat UTI and closely monitor CBGs and control blood glucose. Since patient was complaining of some chest discomfort which patient is not able to characterize we will cycle cardiac markers, d-dimer and 2-D echo. Physical therapy consult. 2. Chest discomfort - patient states yesterday while on the tub she did have some  chest discomfort. Unable to exactly characterize the discomfort. We will cycle cardiac markers, check 2-D echo and d-dimer and chest x-ray. 3. Hypertension - since creatinine is mildly elevated and we do not know the baseline creatinine we will hold lisinopril but continue metoprolol. When necessary IV hydralazine for systolic blood pressure more than 160. 4. Diabetes mellitus type 2 uncontrolled - patient has missed her home dose of Lantus. One dose has been given to the ER. Closely follow metabolic panel for any developing DKA. I have placed patient on moderate dose sliding scale coverage. 5. Diarrhea - check stool studies. Patient denies being on any antibiotics recently. 6. UTI - patient is placed on ceftriaxone. Check urine cultures. 7. Acute renal failure - baseline creatinine not known. For now I would hold off lisinopril and gently hydrate. Follow metabolic panel.   DVT prophylaxis: Lovenox. Code Status: Full code.  Family Communication: Discussed with patient.  Disposition Plan: To be determined.  Consults called: Physical therapy.  Admission  status: Observation.    Eduard Clos MD [facility] Pager [phone].  If 7PM-7AM, please contact night-coverage [email or web address] Password [email or web address]  01/03/2016, 12:17 AM

## 2016-01-04 ENCOUNTER — Inpatient Hospital Stay (HOSPITAL_COMMUNITY): Payer: Medicare Other

## 2016-01-04 DIAGNOSIS — R079 Chest pain, unspecified: Secondary | ICD-10-CM

## 2016-01-04 DIAGNOSIS — E860 Dehydration: Secondary | ICD-10-CM

## 2016-01-04 DIAGNOSIS — M6282 Rhabdomyolysis: Secondary | ICD-10-CM

## 2016-01-04 LAB — GLUCOSE, CAPILLARY
GLUCOSE-CAPILLARY: 185 mg/dL — AB (ref 65–99)
GLUCOSE-CAPILLARY: 191 mg/dL — AB (ref 65–99)
Glucose-Capillary: 244 mg/dL — ABNORMAL HIGH (ref 65–99)
Glucose-Capillary: 224 mg/dL — ABNORMAL HIGH (ref 65–99)

## 2016-01-04 LAB — CBC
HCT: 34.8 % — ABNORMAL LOW (ref 36.0–46.0)
Hemoglobin: 11.1 g/dL — ABNORMAL LOW (ref 12.0–15.0)
MCH: 26.5 pg (ref 26.0–34.0)
MCHC: 31.9 g/dL (ref 30.0–36.0)
MCV: 83.1 fL (ref 78.0–100.0)
PLATELETS: 217 10*3/uL (ref 150–400)
RBC: 4.19 MIL/uL (ref 3.87–5.11)
RDW: 15.5 % (ref 11.5–15.5)
WBC: 12.6 10*3/uL — ABNORMAL HIGH (ref 4.0–10.5)

## 2016-01-04 LAB — ECHOCARDIOGRAM COMPLETE
Height: 64 in
Weight: 3155.2 oz

## 2016-01-04 LAB — C DIFFICILE QUICK SCREEN W PCR REFLEX
C DIFFICILE (CDIFF) INTERP: NOT DETECTED
C Diff antigen: NEGATIVE
C Diff toxin: NEGATIVE

## 2016-01-04 MED ORDER — LISINOPRIL 10 MG PO TABS
10.0000 mg | ORAL_TABLET | Freq: Every day | ORAL | Status: DC
  Administered 2016-01-04 – 2016-01-05 (×2): 10 mg via ORAL
  Filled 2016-01-04 (×2): qty 1

## 2016-01-04 MED ORDER — PERFLUTREN LIPID MICROSPHERE
1.0000 mL | INTRAVENOUS | Status: AC | PRN
  Administered 2016-01-04: 2 mL via INTRAVENOUS
  Filled 2016-01-04: qty 10

## 2016-01-04 MED ORDER — GI COCKTAIL ~~LOC~~
30.0000 mL | Freq: Once | ORAL | Status: AC
  Administered 2016-01-04: 30 mL via ORAL
  Filled 2016-01-04: qty 30

## 2016-01-04 MED ORDER — ZOLPIDEM TARTRATE 5 MG PO TABS
5.0000 mg | ORAL_TABLET | Freq: Once | ORAL | Status: DC
  Filled 2016-01-04 (×3): qty 1

## 2016-01-04 NOTE — Progress Notes (Signed)
PROGRESS NOTE    Anne Cannon  RUE:[id number] DOB: 1946-01-08 DOA: 01/02/2016 PCP: No PCP Per Patient   Brief Narrative: Anne Cannon is a 70 y.o. female with hypertension and diabetes mellitus 2. She presented with weakness and had a UA suggestive of UTI (with symptoms) and associated elevated troponin. Cardiology following. Symptoms improving.   Assessment & Plan:   Principal Problem:   General weakness Active Problems:   Urinary tract infection without hematuria   Diarrhea   Dehydration   Controlled type 2 diabetes mellitus with hyperglycemia (HCC)   Weakness   Generalized weakness Improved today. Suspect secondary to likely rhabdomyolysis. Could consider more rheumatologic etiology, such as PMR. Symptoms improved without steroids, however. -encourage good oral intake -echo pending -will consult PT pending results of echo -consider rheumatologic workup as outpatient if this becomes a recurring issue  Chest discomfort Unknown etiology. Patient had elevated troponin which was mild, and has already trended down. Still has some mild discomfort in epigastric area. -echo pending -cardiology recommendations -will give a GI cocktail to see if any improvement  Hypertension Blood pressure elevated. Lisinopril held on admission secondary to possible AKI. AKI resolved. -restart lisinopril -continue metoprolol -continue hydralazine 10mg  q4hrs prn  Diabetes mellitus, type 2 No charted A1C -continue sliding scale insulin -obtain A1C  Diarrhea C. Difficile and GI pathogen panel negative. Patient having stools but is unsure if loose or watery. Charted stools do not have characteristics listed  UTI Possibly contributing to presentation -continue ceftriaxone -will obtain culture (s/p antibiotics)  AKI Resolved.   DVT prophylaxis: Lovenox Code Status: Full code Family Communication: none at bedside Disposition Plan: Pending possible cardiac workup   Consultants:    Cardiology  Procedures:   None  Antimicrobials:  Ceftriaxone (11/20>>    Subjective: Patient reports feeling better today. She can move her legs and shoulders better.  Objective: Vitals:   01/03/16 1610 01/03/16 1613 01/03/16 2100 01/04/16 0623  BP: (!) 136/110 (!) 141/61 (!) 141/50 (!) 174/75  Pulse: 86 88 81 87  Resp:  18 18 18   Temp: 98.4 F (36.9 C) 98.5 F (36.9 C) (!) 94.4 F (34.7 C) 98.6 F (37 C)  TempSrc: Oral Oral Oral Oral  SpO2: 99% 99% 97% 97%  Weight:    89.4 kg (197 lb 3.2 oz)  Height:        Intake/Output Summary (Last 24 hours) at 01/04/16 0641 Last data filed at 01/03/16 1900  Gross per 24 hour  Intake             1755 ml  Output                0 ml  Net             1755 ml   Filed Weights   01/02/16 1723 01/02/16 2347 01/04/16 0623  Weight: 89.8 kg (198 lb) 86.3 kg (190 lb 3.2 oz) 89.4 kg (197 lb 3.2 oz)    Examination:  General exam: Appears calm and comfortable Respiratory system: Clear to auscultation. Respiratory effort normal. Cardiovascular system: S1 & S2 heard, RRR. No murmurs, rubs, gallops or clicks. Gastrointestinal system: Abdomen is nondistended, soft and nontender. Normal bowel sounds heard. Central nervous system: Alert and oriented. No focal neurological deficits. Extremities: No edema. No calf tenderness Skin: No cyanosis. No rashes Psychiatry: Judgement and insight appear normal. Mood & affect appropriate.     Data Reviewed: I have personally reviewed following labs and imaging studies  CBC:  Recent Labs Lab  01/02/16 1727 01/03/16 0707  WBC 19.9* 16.1*  NEUTROABS  --  10.9*  HGB 12.9 11.0*  HCT 39.0 34.6*  MCV 81.8 82.4  PLT 283 217   Basic Metabolic Panel:  Recent Labs Lab 01/02/16 1727 01/03/16 0707  NA 137 136  K 4.0 3.6  CL 99* 103  CO2 24 23  GLUCOSE 422* 349*  BUN 23* 16  CREATININE 1.13* 0.89  CALCIUM 9.5 8.4*   GFR: Estimated Creatinine Clearance: 63.7 mL/min (by C-G formula based  on SCr of 0.89 mg/dL). Liver Function Tests:  Recent Labs Lab 01/02/16 1727 01/03/16 0707  AST 61* 47*  ALT 69* 50  ALKPHOS 59 47  BILITOT 0.8 0.6  PROT 8.1 6.1*  ALBUMIN 4.0 3.2*    Recent Labs Lab 01/02/16 1727  LIPASE 21   No results for input(s): AMMONIA in the last 168 hours. Coagulation Profile: No results for input(s): INR, PROTIME in the last 168 hours. Cardiac Enzymes:  Recent Labs Lab 01/03/16 0044 01/03/16 0707 01/03/16 1157  CKTOTAL  --  1,154*  --   TROPONINI 0.09* 0.10* 0.05*   BNP (last 3 results) No results for input(s): PROBNP in the last 8760 hours. HbA1C: No results for input(s): HGBA1C in the last 72 hours. CBG:  Recent Labs Lab 01/02/16 2248 01/03/16 0624 01/03/16 1146 01/03/16 1653 01/03/16 2117  GLUCAP 309* 377* 308* 171* 175*   Lipid Profile: No results for input(s): CHOL, HDL, LDLCALC, TRIG, CHOLHDL, LDLDIRECT in the last 72 hours. Thyroid Function Tests: No results for input(s): TSH, T4TOTAL, FREET4, T3FREE, THYROIDAB in the last 72 hours. Anemia Panel: No results for input(s): VITAMINB12, FOLATE, FERRITIN, TIBC, IRON, RETICCTPCT in the last 72 hours. Sepsis Labs: No results for input(s): PROCALCITON, LATICACIDVEN in the last 168 hours.  Recent Results (from the past 240 hour(s))  Gastrointestinal Panel by PCR , Stool     Status: None   Collection Time: 01/03/16 12:15 AM  Result Value Ref Range Status   Campylobacter species NOT DETECTED NOT DETECTED Final   Plesimonas shigelloides NOT DETECTED NOT DETECTED Final   Salmonella species NOT DETECTED NOT DETECTED Final   Yersinia enterocolitica NOT DETECTED NOT DETECTED Final   Vibrio species NOT DETECTED NOT DETECTED Final   Vibrio cholerae NOT DETECTED NOT DETECTED Final   Enteroaggregative E coli (EAEC) NOT DETECTED NOT DETECTED Final   Enteropathogenic E coli (EPEC) NOT DETECTED NOT DETECTED Final   Enterotoxigenic E coli (ETEC) NOT DETECTED NOT DETECTED Final   Shiga  like toxin producing E coli (STEC) NOT DETECTED NOT DETECTED Final   Shigella/Enteroinvasive E coli (EIEC) NOT DETECTED NOT DETECTED Final   Cryptosporidium NOT DETECTED NOT DETECTED Final   Cyclospora cayetanensis NOT DETECTED NOT DETECTED Final   Entamoeba histolytica NOT DETECTED NOT DETECTED Final   Giardia lamblia NOT DETECTED NOT DETECTED Final   Adenovirus F40/41 NOT DETECTED NOT DETECTED Final   Astrovirus NOT DETECTED NOT DETECTED Final   Norovirus GI/GII NOT DETECTED NOT DETECTED Final   Rotavirus A NOT DETECTED NOT DETECTED Final   Sapovirus (I, II, IV, and V) NOT DETECTED NOT DETECTED Final         Radiology Studies: Ct Angio Chest Pe W Or Wo Contrast  Result Date: 01/03/2016 CLINICAL DATA:  70 year old female with chest pain and tachycardia. Initial encounter. EXAM: CT ANGIOGRAPHY CHEST WITH CONTRAST TECHNIQUE: Multidetector CT imaging of the chest was performed using the standard protocol during bolus administration of intravenous contrast. Multiplanar CT image reconstructions and MIPs  were obtained to evaluate the vascular anatomy. CONTRAST:  80 mL Isovue 370 COMPARISON:  Portable chest radiograph 0714 hours today. FINDINGS: Cardiovascular: Adequate contrast bolus timing in the pulmonary arterial tree. Mild respiratory motion at the lung bases. No focal filling defect identified in the pulmonary arteries to suggest acute pulmonary embolism. Mild cardiomegaly. No pericardial effusion. Mild Calcified aortic atherosclerosis. Calcified coronary artery atherosclerosis. Mediastinum/Nodes: No lymphadenopathy. Calcified thyroid nodules incidentally noted at the thoracic inlet, do not appear to meet size criteria for ultrasound follow-up. Lungs/Pleura: Major airways are patent. Somewhat low lung volumes with dependent atelectasis in both lungs. Mild scarring or atelectasis in the lingula. No other abnormal pulmonary opacity. No pleural effusions. Upper Abdomen: Negative visualized liver,  spleen, pancreas, adrenal glands, and stomach in the upper abdomen. Musculoskeletal: No acute osseous abnormality identified. Review of the MIP images confirms the above findings. IMPRESSION: 1.  No evidence of acute pulmonary embolus. 2. Mild atelectasis, no other pulmonary abnormality. 3. Calcified aortic and coronary artery atherosclerosis. Electronically Signed   By: Odessa FlemingH  Hall M.D.   On: 01/03/2016 15:13   Dg Chest Port 1 View  Result Date: 01/03/2016 CLINICAL DATA:  Recent fall, soreness, initial encounter. EXAM: PORTABLE CHEST 1 VIEW COMPARISON:  None. FINDINGS: Trachea is midline. Heart size is accentuated by low lung volumes. Thoracic aorta is calcified. Lungs are clear. No pleural fluid. Osseous structures are grossly intact. IMPRESSION: Low lung volumes.  No acute findings. Electronically Signed   By: Leanna BattlesMelinda  Blietz M.D.   On: 01/03/2016 07:51        Scheduled Meds: . cefTRIAXone (ROCEPHIN)  IV  1 g Intravenous Q24H  . enoxaparin (LOVENOX) injection  40 mg Subcutaneous Q24H  . gabapentin  100 mg Oral QHS  . insulin aspart  0-15 Units Subcutaneous TID WC  . insulin glargine  50 Units Subcutaneous BID  . loratadine  10 mg Oral Daily  . metoprolol succinate  100 mg Oral QHS   Continuous Infusions:   LOS: 1 day     Jacquelin HawkingRalph Akeylah Hendel [facility] 01/04/2016, 6:41 AM Pager: [phone]  If 7PM-7AM, please contact night-coverage [email or web address] Password [email or web address] 01/04/2016, 6:41 AM

## 2016-01-04 NOTE — Progress Notes (Signed)
Inpatient Diabetes Program Recommendations  AACE/ADA: New Consensus Statement on Inpatient Glycemic Control (2015)  Target Ranges:  Prepandial:   less than 140 mg/dL      Peak postprandial:   less than 180 mg/dL (1-2 hours)      Critically ill patients:  140 - 180 mg/dL   Lab Results  Component Value Date   GLUCAP 185 (H) 01/04/2016    Review of Glycemic Control Results for Anne Cannon, Anne Cannon (MRN [id number]) as of 01/04/2016 08:53  Ref. Range 01/03/2016 06:24 01/03/2016 11:46 01/03/2016 16:53 01/03/2016 21:17 01/04/2016 06:58  Glucose-Capillary Latest Ref Range: 65 - 99 mg/dL 756377 (H) 433308 (H) 295171 (H) 175 (H) 185 (H)   Diabetes history: Type 2 diabetes  Outpatient Diabetes medications: Lantus 50 units bid Current orders for Inpatient glycemic control: Lantus 50 units bid, Novolog moderate tid with meals and HS  Inpatient Diabetes Program Recommendations:  Consider adding Novolog 2 units mealtime insulin.  Susette RacerJulie Alexsandra Shontz, RN, BA, MHA, CDE Diabetes Coordinator Inpatient Diabetes Program  [phone] (Team [phone] [facility] Office) 01/04/2016 8:56 AM

## 2016-01-04 NOTE — Progress Notes (Signed)
  Echocardiogram 2D Echocardiogram with Definity has been performed.  Cathie BeamsGREGORY, Larayne Baxley 01/04/2016, 3:28 PM

## 2016-01-04 NOTE — Progress Notes (Signed)
         Chart reviewed.  No change from suggestions of last evening.  I am awaiting the echo to decide on further imaging.

## 2016-01-05 ENCOUNTER — Encounter (HOSPITAL_COMMUNITY): Payer: Self-pay | Admitting: General Practice

## 2016-01-05 DIAGNOSIS — R079 Chest pain, unspecified: Secondary | ICD-10-CM

## 2016-01-05 DIAGNOSIS — R197 Diarrhea, unspecified: Secondary | ICD-10-CM

## 2016-01-05 DIAGNOSIS — N390 Urinary tract infection, site not specified: Principal | ICD-10-CM

## 2016-01-05 LAB — GLUCOSE, CAPILLARY
GLUCOSE-CAPILLARY: 113 mg/dL — AB (ref 65–99)
GLUCOSE-CAPILLARY: 192 mg/dL — AB (ref 65–99)
Glucose-Capillary: 206 mg/dL — ABNORMAL HIGH (ref 65–99)
Glucose-Capillary: 218 mg/dL — ABNORMAL HIGH (ref 65–99)

## 2016-01-05 LAB — CK: CK TOTAL: 274 U/L — AB (ref 38–234)

## 2016-01-05 LAB — HEMOGLOBIN A1C
HEMOGLOBIN A1C: 10 % — AB (ref 4.8–5.6)
Mean Plasma Glucose: 240 mg/dL

## 2016-01-05 MED ORDER — LOPERAMIDE HCL 2 MG PO CAPS
2.0000 mg | ORAL_CAPSULE | Freq: Two times a day (BID) | ORAL | Status: DC | PRN
  Administered 2016-01-08 – 2016-01-09 (×2): 2 mg via ORAL
  Filled 2016-01-05 (×2): qty 1

## 2016-01-05 NOTE — Progress Notes (Signed)
Patient Name: Anne HusbandsLinda Tischer Date of Encounter: 01/05/2016  Primary Cardiologist:    Dr. Antoine PocheHochrein (new)  Hospital Problem List     Principal Problem:   General weakness Active Problems:   Urinary tract infection without hematuria   Diarrhea   Dehydration   Controlled type 2 diabetes mellitus with hyperglycemia (HCC)   Weakness     Subjective   Denies chest pain.  Chronic difficulty with deep breaths.  Inpatient Medications    Scheduled Meds: . cefTRIAXone (ROCEPHIN)  IV  1 g Intravenous Q24H  . enoxaparin (LOVENOX) injection  40 mg Subcutaneous Q24H  . gabapentin  100 mg Oral QHS  . insulin aspart  0-15 Units Subcutaneous TID WC  . insulin glargine  50 Units Subcutaneous BID  . lisinopril  10 mg Oral Daily  . loratadine  10 mg Oral Daily  . metoprolol succinate  100 mg Oral QHS  . zolpidem  5 mg Oral Once   Continuous Infusions:  PRN Meds: hydrALAZINE, ondansetron **OR** ondansetron (ZOFRAN) IV   Vital Signs    Vitals:   01/04/16 1500 01/04/16 2100 01/05/16 0500 01/05/16 0909  BP: (!) 163/70 (!) 185/74 (!) 191/71 (!) 151/79  Pulse: 88 84 77 88  Resp: 18 18 17 18   Temp: 98.4 F (36.9 C) 97.9 F (36.6 C) 97.7 F (36.5 C) 97.8 F (36.6 C)  TempSrc: Oral Oral Oral Oral  SpO2: 98% 99% 97% 99%  Weight:      Height:        Intake/Output Summary (Last 24 hours) at 01/05/16 1011 Last data filed at 01/05/16 0900  Gross per 24 hour  Intake              700 ml  Output              800 ml  Net             -100 ml   Filed Weights   01/02/16 1723 01/02/16 2347 01/04/16 0623  Weight: 198 lb (89.8 kg) 190 lb 3.2 oz (86.3 kg) 197 lb 3.2 oz (89.4 kg)    Physical Exam    GEN: NAD.  Neck:  no JVD Cardiac: Regular Rate and Rhythm, nomurmurs, rubs, or gallops.  noedema.  Radials/DP/PT 2+  and equal bilaterally.  Respiratory:  Respirations  regular and unlabored, clear to auscultation bilaterally. GI: Soft, nontender, nondistended, BS + x 4. Skin: warm and dry,  no rash. Neuro:   Strength and sensation are intact. Psych:  AAOx3.  Normal affect.  Labs    CBC  Recent Labs  01/03/16 0707 01/04/16 0859  WBC 16.1* 12.6*  NEUTROABS 10.9*  --   HGB 11.0* 11.1*  HCT 34.6* 34.8*  MCV 82.4 83.1  PLT 217 217   Basic Metabolic Panel  Recent Labs  01/02/16 1727 01/03/16 0707  NA 137 136  K 4.0 3.6  CL 99* 103  CO2 24 23  GLUCOSE 422* 349*  BUN 23* 16  CREATININE 1.13* 0.89  CALCIUM 9.5 8.4*   Liver Function Tests  Recent Labs  01/02/16 1727 01/03/16 0707  AST 61* 47*  ALT 69* 50  ALKPHOS 59 47  BILITOT 0.8 0.6  PROT 8.1 6.1*  ALBUMIN 4.0 3.2*    Recent Labs  01/02/16 1727  LIPASE 21   Cardiac Enzymes  Recent Labs  01/03/16 0044 01/03/16 0707 01/03/16 1157 01/05/16 0716  CKTOTAL  --  1,154*  --  274*  TROPONINI 0.09* 0.10* 0.05*  --  BNP Invalid input(s): POCBNP D-Dimer  Recent Labs  01/03/16 0707  DDIMER 1.38*   Hemoglobin A1C  Recent Labs  01/04/16 0859  HGBA1C 10.0*   Fasting Lipid Panel No results for input(s): CHOL, HDL, LDLCALC, TRIG, CHOLHDL, LDLDIRECT in the last 72 hours. Thyroid Function Tests No results for input(s): TSH, T4TOTAL, T3FREE, THYROIDAB in the last 72 hours.  Invalid input(s): FREET3  Telemetry    NSR - Personally Reviewed  ECG    NA - Personally Reviewed  Radiology    Ct Angio Chest Pe W Or Wo Contrast  Result Date: 01/03/2016 CLINICAL DATA:  [age] year old female with chest pain and tachycardia. Initial encounter. EXAM: CT ANGIOGRAPHY CHEST WITH CONTRAST TECHNIQUE: Multidetector CT imaging of the chest was performed using the standard protocol during bolus administration of intravenous contrast. Multiplanar CT image reconstructions and MIPs were obtained to evaluate the vascular anatomy. CONTRAST:  80 mL Isovue 370 COMPARISON:  Portable chest radiograph 0714 hours today. FINDINGS: Cardiovascular: Adequate contrast bolus timing in the pulmonary arterial tree. Mild  respiratory motion at the lung bases. No focal filling defect identified in the pulmonary arteries to suggest acute pulmonary embolism. Mild cardiomegaly. No pericardial effusion. Mild Calcified aortic atherosclerosis. Calcified coronary artery atherosclerosis. Mediastinum/Nodes: No lymphadenopathy. Calcified thyroid nodules incidentally noted at the thoracic inlet, do not appear to meet size criteria for ultrasound follow-up. Lungs/Pleura: Major airways are patent. Somewhat low lung volumes with dependent atelectasis in both lungs. Mild scarring or atelectasis in the lingula. No other abnormal pulmonary opacity. No pleural effusions. Upper Abdomen: Negative visualized liver, spleen, pancreas, adrenal glands, and stomach in the upper abdomen. Musculoskeletal: No acute osseous abnormality identified. Review of the MIP images confirms the above findings. IMPRESSION: 1.  No evidence of acute pulmonary embolus. 2. Mild atelectasis, no other pulmonary abnormality. 3. Calcified aortic and coronary artery atherosclerosis. Electronically Signed   By: Odessa FlemingH  Hall M.D.   On: 01/03/2016 15:13    Cardiac Studies   ECHO - Left ventricle: The cavity size was normal. Systolic function was   normal. The estimated ejection fraction was in the range of 55%   to 60%. Wall motion was normal; there were no regional wall   motion abnormalities. Doppler parameters are consistent with   abnormal left ventricular relaxation (grade 1 diastolic   dysfunction). - Mitral valve: Calcified annulus. - Left atrium: The atrium was mildly dilated.  Patient Profile     70 y.o. Female admitted with weakness  Assessment & Plan    ELEVATED TROPONIN:  She denied this to me on initial evaluation but reports this to others.  I do not suspect an ACS.   She can have a [facility] as an out patient because of her risk factors.    HTN:  BP is still elevated.  Lisinopril was restarted yesterday.  She might need a higher dose of this if BP  still elevated before discharge.   DM:  A1C is 10.  Per primary team.     Signed, Rollene RotundaJames Joon Pohle, MD  01/05/2016, 10:11 AM

## 2016-01-05 NOTE — Progress Notes (Addendum)
PROGRESS NOTE    Anne Cannon  ZOX:[id number] DOB: 1945-12-04 DOA: 01/02/2016 PCP: No PCP Per Patient   Brief Narrative: Anne Cannon is a 70 y.o. female with hypertension and diabetes mellitus 2. She presented with weakness and had a UA suggestive of UTI (with symptoms) and associated elevated troponin. Cardiology following. Symptoms improving. Patient gave history of eating seafood at a restaurant ([employer]) and several hours later started having diarrhea without abdominal pain, nausea or vomiting. Denies other family with same symptoms. Cardiology does not think that she has ACS and recommend outpatient Myoview. Clinically improving. Still has diarrhea but subsiding.   Assessment & Plan:   Principal Problem:   General weakness Active Problems:   Urinary tract infection without hematuria   Diarrhea   Dehydration   Controlled type 2 diabetes mellitus with hyperglycemia (HCC)   Weakness   Generalized weakness Likely secondary to acute medical illness including diarrhea, associated dehydration, mild acute kidney injury and rhabdomyolysis. Clinically improving. Physical therapy evaluated 11/22 and recommend SNF. Low index of suspicion for PMR or other rheumatic etiologies. Echo shows normal EF.  Chest discomfort/elevated troponin Cardiology consultation and follow-up appreciated. Inconsistent history. Cardiology does not suspect ACS and recommend outpatient Lexiscan Myoview because of her risk factors. Chest pain resolved. CTA chest: No PE.  Hypertension Blood pressure elevated. Lisinopril held on admission secondary to possible AKI. AKI resolved. -restarted lisinopril -continue metoprolol -continue hydralazine 10mg  q4hrs prn. May need to adjust her medications if blood pressures not adequately controlled.  Diabetes mellitus, type 2 -continue sliding scale insulin -obtain A1C: 10 suggesting very poor outpatient control. Reasonable inpatient control over the last 24 hours. Continue  Lantus and NovoLog SSI which may need adjustment as outpatient.   Diarrhea C. Difficile and GI pathogen panel negative. Patient states that her diarrhea started after eating out at a seafood restaurant. Diarrhea had somewhat improved but again had 2 episodes of loose stools on 11/22. Trial of Imodium. Suspicious for food poisoning versus acute viral GE.   UTI Possibly contributing to presentation - Completed 3 doses of IV Rocephin and completed course. Discontinue Rocephin .  AKI Resolved.  Mild rhabdomyolysis - Possibly from laying in the bathtub for several hours. Presented with CK of 1154. Improved with CK 274.  Anemia - Stable. Outpatient follow-up.   DVT prophylaxis: Lovenox Code Status: Full code Family Communication: none at bedside Disposition Plan: PT is recommending SNF. Consult clinical Child psychotherapist. Possible discharge in the next 24 hours pending improvement in her diarrhea.    Consultants:   Cardiology  Procedures:   2-D echo 01/04/16: Study Conclusions  - Left ventricle: The cavity size was normal. Systolic function was   normal. The estimated ejection fraction was in the range of 55%   to 60%. Wall motion was normal; there were no regional wall   motion abnormalities. Doppler parameters are consistent with   abnormal left ventricular relaxation (grade 1 diastolic   dysfunction). - Mitral valve: Calcified annulus. - Left atrium: The atrium was mildly dilated.  Impressions:  - Technically difficult; definity used; normal LV systolic   function; grade 1 diastolic dysfunction; mild LAE.  Antimicrobials:  Ceftriaxone (11/19>> 11/21    Subjective: States that she was feeling better with improvement in her diarrhea and strength until this morning and had 2 episodes of loose stools. Denies abdominal pain, nausea or vomiting. Tolerating diet. No chest pain reported. Denies muscle aches or soreness.   Objective: Vitals:   01/04/16 1500 01/04/16 2100  01/05/16 0500  01/05/16 0909  BP: (!) 163/70 (!) 185/74 (!) 191/71 (!) 151/79  Pulse: 88 84 77 88  Resp: 18 18 17 18   Temp: 98.4 F (36.9 C) 97.9 F (36.6 C) 97.7 F (36.5 C) 97.8 F (36.6 C)  TempSrc: Oral Oral Oral Oral  SpO2: 98% 99% 97% 99%  Weight:      Height:        Intake/Output Summary (Last 24 hours) at 01/05/16 1439 Last data filed at 01/05/16 0900  Gross per 24 hour  Intake              700 ml  Output              800 ml  Net             -100 ml   Filed Weights   01/02/16 1723 01/02/16 2347 01/04/16 0623  Weight: 89.8 kg (198 lb) 86.3 kg (190 lb 3.2 oz) 89.4 kg (197 lb 3.2 oz)    Examination:  General exam: Appears calm and comfortable Respiratory system: Clear to auscultation. Respiratory effort normal. Cardiovascular system: S1 & S2 heard, RRR. No murmurs, rubs, gallops or clicks. Tele: SR Gastrointestinal system: Abdomen is nondistended, soft and nontender. Normal bowel sounds heard. Central nervous system: Alert and oriented. No focal neurological deficits. Extremities: No edema. No calf tenderness Skin: No cyanosis. No rashes Psychiatry: Judgement and insight appear impaired. Mood & affect appropriate.     Data Reviewed: I have personally reviewed following labs and imaging studies  CBC:  Recent Labs Lab 01/02/16 1727 01/03/16 0707 01/04/16 0859  WBC 19.9* 16.1* 12.6*  NEUTROABS  --  10.9*  --   HGB 12.9 11.0* 11.1*  HCT 39.0 34.6* 34.8*  MCV 81.8 82.4 83.1  PLT 283 217 217   Basic Metabolic Panel:  Recent Labs Lab 01/02/16 1727 01/03/16 0707  NA 137 136  K 4.0 3.6  CL 99* 103  CO2 24 23  GLUCOSE 422* 349*  BUN 23* 16  CREATININE 1.13* 0.89  CALCIUM 9.5 8.4*   GFR: Estimated Creatinine Clearance: 63.7 mL/min (by C-G formula based on SCr of 0.89 mg/dL). Liver Function Tests:  Recent Labs Lab 01/02/16 1727 01/03/16 0707  AST 61* 47*  ALT 69* 50  ALKPHOS 59 47  BILITOT 0.8 0.6  PROT 8.1 6.1*  ALBUMIN 4.0 3.2*     Recent Labs Lab 01/02/16 1727  LIPASE 21   No results for input(s): AMMONIA in the last 168 hours. Coagulation Profile: No results for input(s): INR, PROTIME in the last 168 hours. Cardiac Enzymes:  Recent Labs Lab 01/03/16 0044 01/03/16 0707 01/03/16 1157 01/05/16 0716  CKTOTAL  --  1,154*  --  274*  TROPONINI 0.09* 0.10* 0.05*  --    BNP (last 3 results) No results for input(s): PROBNP in the last 8760 hours. HbA1C:  Recent Labs  01/04/16 0859  HGBA1C 10.0*   CBG:  Recent Labs Lab 01/04/16 1136 01/04/16 1622 01/04/16 2117 01/05/16 0646 01/05/16 1139  GLUCAP 244* 224* 191* 113* 192*   Lipid Profile: No results for input(s): CHOL, HDL, LDLCALC, TRIG, CHOLHDL, LDLDIRECT in the last 72 hours. Thyroid Function Tests: No results for input(s): TSH, T4TOTAL, FREET4, T3FREE, THYROIDAB in the last 72 hours. Anemia Panel: No results for input(s): VITAMINB12, FOLATE, FERRITIN, TIBC, IRON, RETICCTPCT in the last 72 hours. Sepsis Labs: No results for input(s): PROCALCITON, LATICACIDVEN in the last 168 hours.  Recent Results (from the past 240 hour(s))  Gastrointestinal Panel by  PCR , Stool     Status: None   Collection Time: 01/03/16 12:15 AM  Result Value Ref Range Status   Campylobacter species NOT DETECTED NOT DETECTED Final   Plesimonas shigelloides NOT DETECTED NOT DETECTED Final   Salmonella species NOT DETECTED NOT DETECTED Final   Yersinia enterocolitica NOT DETECTED NOT DETECTED Final   Vibrio species NOT DETECTED NOT DETECTED Final   Vibrio cholerae NOT DETECTED NOT DETECTED Final   Enteroaggregative E coli (EAEC) NOT DETECTED NOT DETECTED Final   Enteropathogenic E coli (EPEC) NOT DETECTED NOT DETECTED Final   Enterotoxigenic E coli (ETEC) NOT DETECTED NOT DETECTED Final   Shiga like toxin producing E coli (STEC) NOT DETECTED NOT DETECTED Final   Shigella/Enteroinvasive E coli (EIEC) NOT DETECTED NOT DETECTED Final   Cryptosporidium NOT DETECTED NOT  DETECTED Final   Cyclospora cayetanensis NOT DETECTED NOT DETECTED Final   Entamoeba histolytica NOT DETECTED NOT DETECTED Final   Giardia lamblia NOT DETECTED NOT DETECTED Final   Adenovirus F40/41 NOT DETECTED NOT DETECTED Final   Astrovirus NOT DETECTED NOT DETECTED Final   Norovirus GI/GII NOT DETECTED NOT DETECTED Final   Rotavirus A NOT DETECTED NOT DETECTED Final   Sapovirus (I, II, IV, and V) NOT DETECTED NOT DETECTED Final  C difficile quick scan w PCR reflex     Status: None   Collection Time: 01/04/16  6:27 AM  Result Value Ref Range Status   C Diff antigen NEGATIVE NEGATIVE Final   C Diff toxin NEGATIVE NEGATIVE Final   C Diff interpretation No C. difficile detected.  Final         Radiology Studies: Ct Angio Chest Pe W Or Wo Contrast  Result Date: 01/03/2016 CLINICAL DATA:  70 year old female with chest pain and tachycardia. Initial encounter. EXAM: CT ANGIOGRAPHY CHEST WITH CONTRAST TECHNIQUE: Multidetector CT imaging of the chest was performed using the standard protocol during bolus administration of intravenous contrast. Multiplanar CT image reconstructions and MIPs were obtained to evaluate the vascular anatomy. CONTRAST:  80 mL Isovue 370 COMPARISON:  Portable chest radiograph 0714 hours today. FINDINGS: Cardiovascular: Adequate contrast bolus timing in the pulmonary arterial tree. Mild respiratory motion at the lung bases. No focal filling defect identified in the pulmonary arteries to suggest acute pulmonary embolism. Mild cardiomegaly. No pericardial effusion. Mild Calcified aortic atherosclerosis. Calcified coronary artery atherosclerosis. Mediastinum/Nodes: No lymphadenopathy. Calcified thyroid nodules incidentally noted at the thoracic inlet, do not appear to meet size criteria for ultrasound follow-up. Lungs/Pleura: Major airways are patent. Somewhat low lung volumes with dependent atelectasis in both lungs. Mild scarring or atelectasis in the lingula. No other  abnormal pulmonary opacity. No pleural effusions. Upper Abdomen: Negative visualized liver, spleen, pancreas, adrenal glands, and stomach in the upper abdomen. Musculoskeletal: No acute osseous abnormality identified. Review of the MIP images confirms the above findings. IMPRESSION: 1.  No evidence of acute pulmonary embolus. 2. Mild atelectasis, no other pulmonary abnormality. 3. Calcified aortic and coronary artery atherosclerosis. Electronically Signed   By: Odessa FlemingH  Hall M.D.   On: 01/03/2016 15:13        Scheduled Meds: . cefTRIAXone (ROCEPHIN)  IV  1 g Intravenous Q24H  . enoxaparin (LOVENOX) injection  40 mg Subcutaneous Q24H  . gabapentin  100 mg Oral QHS  . insulin aspart  0-15 Units Subcutaneous TID WC  . insulin glargine  50 Units Subcutaneous BID  . lisinopril  10 mg Oral Daily  . loratadine  10 mg Oral Daily  . metoprolol succinate  100 mg Oral QHS  . zolpidem  5 mg Oral Once   Continuous Infusions:   LOS: 2 days     Camyah Pultz, MD, FACP, FHM. [facility] Pager [phone]  If 7PM-7AM, please contact night-coverage [email or web address] Password [email or web address] 01/05/2016, 2:49 PM

## 2016-01-05 NOTE — Evaluation (Signed)
Physical Therapy Evaluation Patient Details Name: Anne HusbandsLinda Rump MRN: [id number] DOB: [birth year] Today's Date: 01/05/2016   History of Present Illness  Pt is a 70 y/o female admitted secondary to generalized weakness. PMH including but not limited to HTN and DM.  Clinical Impression  Pt presented sitting EOB when PT entered room. Pt was awake and willing to participate in therapy session. Prior to admission, pt stated that she was mod I with functional mobility with use of SPC. Pt stated that she will likely d/c to her daughter's home because her son is "working on the house" and "putting holes in the walls". Pt stated that her daughter does work and would not be available for 24 hour supervision. Pt required mod A x2 to stand. PT evaluation limited secondary to pt with diarrhea and needing to use bed pan. Pt would continue to benefit from skilled physical therapy services at this time while admitted and after d/c to address her limitations in order to improve her overall safety and independence with functional mobility.     Follow Up Recommendations SNF;Supervision/Assistance - 24 hour    Equipment Recommendations  None recommended by PT    Recommendations for Other Services       Precautions / Restrictions Precautions Precautions: Fall Restrictions Weight Bearing Restrictions: No      Mobility  Bed Mobility               General bed mobility comments: pt sitting EOB when PT entered room  Transfers Overall transfer level: Needs assistance Equipment used: Rolling walker (2 wheeled);2 person hand held assist Transfers: Sit to/from Stand Sit to Stand: Mod assist;+2 physical assistance         General transfer comment: Pt unsuccessful attempt x3 to perform transfer with max A x1 and RW. Pt finally able to perform sit-to-stand with mod A x2 with 2 person HHA.  Ambulation/Gait             General Gait Details: unable to during evaluation secondary to pt with diarrhea  and needing to use bed pan  Stairs            Wheelchair Mobility    Modified Rankin (Stroke Patients Only)       Balance Overall balance assessment: Needs assistance Sitting-balance support: Feet supported;No upper extremity supported Sitting balance-Leahy Scale: Fair     Standing balance support: During functional activity;Bilateral upper extremity supported Standing balance-Leahy Scale: Poor Standing balance comment: pt required 2 person HHA in standing                             Pertinent Vitals/Pain Pain Assessment: No/denies pain    Home Living Family/patient expects to be discharged to:: Private residence Living Arrangements: Children Available Help at Discharge: Family;Available PRN/intermittently Type of Home: House Home Access: Stairs to enter Entrance Stairs-Rails: Doctor, general practiceight;Left Entrance Stairs-Number of Steps: 2 Home Layout: One level Home Equipment: Cane - single point      Prior Function Level of Independence: Independent with assistive device(s)         Comments: pt stated that she ambulated with use of SPC     Hand Dominance        Extremity/Trunk Assessment   Upper Extremity Assessment: Generalized weakness           Lower Extremity Assessment: Generalized weakness;RLE deficits/detail;LLE deficits/detail RLE Deficits / Details: MMT revealed 3+/5 for hip flexion, hip abduction, hip adduction, and knee flexion; 4/5  for knee extension and ankle DF. LLE Deficits / Details: MMT revealed 3+/5 for hip flexion, hip abduction, hip adduction, and knee flexion; 4/5 for knee extension and ankle DF.  Cervical / Trunk Assessment: Kyphotic  Communication   Communication: No difficulties  Cognition Arousal/Alertness: Awake/alert Behavior During Therapy: WFL for tasks assessed/performed Overall Cognitive Status: No family/caregiver present to determine baseline cognitive functioning       Memory: Decreased short-term memory               General Comments      Exercises     Assessment/Plan    PT Assessment Patient needs continued PT services  PT Problem List Decreased strength;Decreased range of motion;Decreased activity tolerance;Decreased balance;Decreased mobility;Decreased coordination;Decreased knowledge of use of DME;Decreased safety awareness          PT Treatment Interventions DME instruction;Gait training;Stair training;Functional mobility training;Therapeutic activities;Therapeutic exercise;Balance training;Neuromuscular re-education;Patient/family education    PT Goals (Current goals can be found in the Care Plan section)  Acute Rehab PT Goals Patient Stated Goal: to get back in bed PT Goal Formulation: With patient Time For Goal Achievement: 01/19/16 Potential to Achieve Goals: Fair    Frequency Min 3X/week   Barriers to discharge        Co-evaluation               End of Session Equipment Utilized During Treatment: Gait belt Activity Tolerance: Patient limited by fatigue;Other (comment) (pt limited secondary to diarrhea) Patient left: in bed;with call bell/phone within reach;with bed alarm set Nurse Communication: Mobility status         Time: [id number] PT Time Calculation (min) (ACUTE ONLY): 17 min   Charges:   PT Evaluation $PT Eval Moderate Complexity: 1 Procedure     PT G CodesAlessandra Bevels:        Adrion Menz M Mescal Flinchbaugh 01/05/2016, 9:29 AM Deborah ChalkJennifer Makenleigh Crownover, PT, DPT [phone]

## 2016-01-06 DIAGNOSIS — E1022 Type 1 diabetes mellitus with diabetic chronic kidney disease: Secondary | ICD-10-CM

## 2016-01-06 DIAGNOSIS — I10 Essential (primary) hypertension: Secondary | ICD-10-CM

## 2016-01-06 DIAGNOSIS — I129 Hypertensive chronic kidney disease with stage 1 through stage 4 chronic kidney disease, or unspecified chronic kidney disease: Secondary | ICD-10-CM

## 2016-01-06 DIAGNOSIS — N189 Chronic kidney disease, unspecified: Secondary | ICD-10-CM

## 2016-01-06 DIAGNOSIS — R7989 Other specified abnormal findings of blood chemistry: Secondary | ICD-10-CM

## 2016-01-06 DIAGNOSIS — R778 Other specified abnormalities of plasma proteins: Secondary | ICD-10-CM

## 2016-01-06 DIAGNOSIS — R748 Abnormal levels of other serum enzymes: Secondary | ICD-10-CM

## 2016-01-06 LAB — GLUCOSE, CAPILLARY
GLUCOSE-CAPILLARY: 111 mg/dL — AB (ref 65–99)
GLUCOSE-CAPILLARY: 246 mg/dL — AB (ref 65–99)
GLUCOSE-CAPILLARY: 340 mg/dL — AB (ref 65–99)
Glucose-Capillary: 158 mg/dL — ABNORMAL HIGH (ref 65–99)
Glucose-Capillary: 274 mg/dL — ABNORMAL HIGH (ref 65–99)

## 2016-01-06 MED ORDER — LISINOPRIL 20 MG PO TABS
20.0000 mg | ORAL_TABLET | Freq: Every day | ORAL | Status: DC
  Administered 2016-01-06 – 2016-01-09 (×4): 20 mg via ORAL
  Filled 2016-01-06 (×4): qty 1

## 2016-01-06 MED ORDER — HYDROCHLOROTHIAZIDE 25 MG PO TABS
25.0000 mg | ORAL_TABLET | Freq: Every day | ORAL | Status: DC
  Administered 2016-01-06 – 2016-01-09 (×4): 25 mg via ORAL
  Filled 2016-01-06 (×4): qty 1

## 2016-01-06 NOTE — Progress Notes (Signed)
PROGRESS NOTE    Anne HusbandsLinda Harden  ZOX:[id number] DOB: August 10, 1945 DOA: 01/02/2016 PCP: No PCP Per Patient   Brief Narrative: Anne Cannon is a 70 y.o. female with hypertension and diabetes mellitus 2. She presented with weakness and had a UA suggestive of UTI (with symptoms) and associated elevated troponin. Cardiology following. Symptoms improving. Patient gave history of eating seafood at a restaurant ([employer]) and several hours later started having diarrhea without abdominal pain, nausea or vomiting. Denies other family with same symptoms. Cardiology does not think that she has ACS and recommend outpatient Myoview. Clinically improved and stable for discharge but awaiting SNF. At discharge, cardiology to be contacted to arrange [place] [facility].  Assessment & Plan:   Principal Problem:   General weakness Active Problems:   Urinary tract infection without hematuria   Diarrhea   Dehydration   Controlled type 2 diabetes mellitus with hyperglycemia (HCC)   Weakness   Chest pain   Hypertension associated with chronic kidney disease due to type 1 diabetes mellitus (HCC)   Elevated troponin   Generalized weakness Likely secondary to acute medical illness including diarrhea, associated dehydration, mild acute kidney injury and rhabdomyolysis. Clinically improving. Physical therapy evaluated 11/22 and recommend SNF. Low index of suspicion for PMR or other rheumatic etiologies. Echo shows normal EF. Awaiting SNF.  Chest discomfort/elevated troponin Cardiology consultation and follow-up appreciated. Inconsistent history. Cardiology does not suspect ACS and recommend outpatient Lexiscan Myoview because of her risk factors. Chest pain resolved. CTA chest: No PE. Cardiology signed off. Contact cardiology prior to discharge to arrange [place] [facility].  Hypertension Uncontrolled. Lisinopril which was held on admission secondary to  possible AKI was resumed, metoprolol was continued. Due to persistent high blood pressure, lisinopril was increased from 10 MG to 20 MG daily on 11/23 and HCTZ was added by cardiology. Continue when necessary IV hydralazine.  Diabetes mellitus, type 2 -continue sliding scale insulin -obtain A1C: 10 suggesting very poor outpatient control.Fluctuating and mildly uncontrolled CBGs over the last 24 hours. Continue Lantus and NovoLog SSI which may need adjustment as outpatient.   Diarrhea C. Difficile and GI pathogen panel negative. Patient states that her diarrhea started after eating out at a seafood restaurant. Diarrhea had somewhat improved but again had 2 episodes of loose stools on 11/22. Trial of Imodium. Suspicious for food poisoning versus acute viral GE.  Improved.  UTI Possibly contributing to presentation - Completed 3 doses of IV Rocephin and completed course. Discontinued Rocephin .  AKI Resolved.  Mild rhabdomyolysis - Possibly from laying in the bathtub for several hours. Presented with CK of 1154. Improved with CK 274.  Anemia - Stable. Outpatient follow-up.   DVT prophylaxis: Lovenox Code Status: Full code Family Communication: none at bedside Disposition Plan: PT is recommending SNF. Consult clinical Child psychotherapistsocial worker. Stable for DC to SNF when bed available.  Consultants:   Cardiology  Procedures:   2-D echo 01/04/16: Study Conclusions  - Left ventricle: The cavity size was normal. Systolic function was   normal. The estimated ejection fraction was in the range of 55%   to 60%. Wall motion was normal; there were no regional wall   motion abnormalities. Doppler parameters are consistent with   abnormal left ventricular relaxation (grade 1 diastolic   dysfunction). - Mitral valve: Calcified annulus. - Left atrium: The atrium was mildly dilated.  Impressions:  - Technically difficult; definity used; normal LV systolic   function; grade 1  diastolic  dysfunction; mild LAE.  Antimicrobials:  Ceftriaxone (11/19>> 11/21    Subjective: Feeling better. No BM since yesterday. No abdominal pain or distention reported. Denies any other complaints. As per RN, no acute issues.  Objective: Vitals:   01/05/16 0909 01/05/16 1500 01/05/16 1930 01/06/16 0600  BP: (!) 151/79 (!) 186/78 (!) 159/73 (!) 181/72  Pulse: 88 76 90 81  Resp: 18 18 20 18   Temp: 97.8 F (36.6 C) 98.4 F (36.9 C) 97.5 F (36.4 C) 97.8 F (36.6 C)  TempSrc: Oral Oral Oral Oral  SpO2: 99% 98% 95% 95%  Weight:      Height:        Intake/Output Summary (Last 24 hours) at 01/06/16 1454 Last data filed at 01/06/16 1038  Gross per 24 hour  Intake              840 ml  Output                0 ml  Net              840 ml   Filed Weights   01/02/16 1723 01/02/16 2347 01/04/16 0623  Weight: 89.8 kg (198 lb) 86.3 kg (190 lb 3.2 oz) 89.4 kg (197 lb 3.2 oz)    Examination:  General exam: Appears calm and comfortable Respiratory system: Clear to auscultation. Respiratory effort normal. Cardiovascular system: S1 & S2 heard, RRR. No murmurs, rubs, gallops or clicks. Gastrointestinal system: Abdomen is obese, soft and nontender. Normal bowel sounds heard. Central nervous system: Alert and oriented. No focal neurological deficits. Extremities: No edema. No calf tenderness Skin: No cyanosis. No rashes Psychiatry: Judgement and insight appear impaired. Mood & affect appropriate.     Data Reviewed: I have personally reviewed following labs and imaging studies  CBC:  Recent Labs Lab 01/02/16 1727 01/03/16 0707 01/04/16 0859  WBC 19.9* 16.1* 12.6*  NEUTROABS  --  10.9*  --   HGB 12.9 11.0* 11.1*  HCT 39.0 34.6* 34.8*  MCV 81.8 82.4 83.1  PLT 283 217 217   Basic Metabolic Panel:  Recent Labs Lab 01/02/16 1727 01/03/16 0707  NA 137 136  K 4.0 3.6  CL 99* 103  CO2 24 23  GLUCOSE 422* 349*  BUN 23* 16  CREATININE 1.13* 0.89  CALCIUM 9.5 8.4*    GFR: Estimated Creatinine Clearance: 63.7 mL/min (by C-G formula based on SCr of 0.89 mg/dL). Liver Function Tests:  Recent Labs Lab 01/02/16 1727 01/03/16 0707  AST 61* 47*  ALT 69* 50  ALKPHOS 59 47  BILITOT 0.8 0.6  PROT 8.1 6.1*  ALBUMIN 4.0 3.2*    Recent Labs Lab 01/02/16 1727  LIPASE 21   No results for input(s): AMMONIA in the last 168 hours. Coagulation Profile: No results for input(s): INR, PROTIME in the last 168 hours. Cardiac Enzymes:  Recent Labs Lab 01/03/16 0044 01/03/16 0707 01/03/16 1157 01/05/16 0716  CKTOTAL  --  1,154*  --  274*  TROPONINI 0.09* 0.10* 0.05*  --    BNP (last 3 results) No results for input(s): PROBNP in the last 8760 hours. HbA1C:  Recent Labs  01/04/16 0859  HGBA1C 10.0*   CBG:  Recent Labs Lab 01/05/16 1139 01/05/16 1642 01/05/16 2104 01/06/16 0611 01/06/16 1200  GLUCAP 192* 206* 218* 111* 158*   Lipid Profile: No results for input(s): CHOL, HDL, LDLCALC, TRIG, CHOLHDL, LDLDIRECT in the last 72 hours. Thyroid Function Tests: No results for input(s): TSH, T4TOTAL, FREET4,  T3FREE, THYROIDAB in the last 72 hours. Anemia Panel: No results for input(s): VITAMINB12, FOLATE, FERRITIN, TIBC, IRON, RETICCTPCT in the last 72 hours. Sepsis Labs: No results for input(s): PROCALCITON, LATICACIDVEN in the last 168 hours.  Recent Results (from the past 240 hour(s))  Gastrointestinal Panel by PCR , Stool     Status: None   Collection Time: 01/03/16 12:15 AM  Result Value Ref Range Status   Campylobacter species NOT DETECTED NOT DETECTED Final   Plesimonas shigelloides NOT DETECTED NOT DETECTED Final   Salmonella species NOT DETECTED NOT DETECTED Final   Yersinia enterocolitica NOT DETECTED NOT DETECTED Final   Vibrio species NOT DETECTED NOT DETECTED Final   Vibrio cholerae NOT DETECTED NOT DETECTED Final   Enteroaggregative E coli (EAEC) NOT DETECTED NOT DETECTED Final   Enteropathogenic E coli (EPEC) NOT DETECTED  NOT DETECTED Final   Enterotoxigenic E coli (ETEC) NOT DETECTED NOT DETECTED Final   Shiga like toxin producing E coli (STEC) NOT DETECTED NOT DETECTED Final   Shigella/Enteroinvasive E coli (EIEC) NOT DETECTED NOT DETECTED Final   Cryptosporidium NOT DETECTED NOT DETECTED Final   Cyclospora cayetanensis NOT DETECTED NOT DETECTED Final   Entamoeba histolytica NOT DETECTED NOT DETECTED Final   Giardia lamblia NOT DETECTED NOT DETECTED Final   Adenovirus F40/41 NOT DETECTED NOT DETECTED Final   Astrovirus NOT DETECTED NOT DETECTED Final   Norovirus GI/GII NOT DETECTED NOT DETECTED Final   Rotavirus A NOT DETECTED NOT DETECTED Final   Sapovirus (I, II, IV, and V) NOT DETECTED NOT DETECTED Final  C difficile quick scan w PCR reflex     Status: None   Collection Time: 01/04/16  6:27 AM  Result Value Ref Range Status   C Diff antigen NEGATIVE NEGATIVE Final   C Diff toxin NEGATIVE NEGATIVE Final   C Diff interpretation No C. difficile detected.  Final         Radiology Studies: No results found.      Scheduled Meds: . enoxaparin (LOVENOX) injection  40 mg Subcutaneous Q24H  . gabapentin  100 mg Oral QHS  . hydrochlorothiazide  25 mg Oral Daily  . insulin aspart  0-15 Units Subcutaneous TID WC  . insulin glargine  50 Units Subcutaneous BID  . lisinopril  20 mg Oral Daily  . loratadine  10 mg Oral Daily  . metoprolol succinate  100 mg Oral QHS  . zolpidem  5 mg Oral Once   Continuous Infusions:   LOS: 3 days     England Greb, MD, FACP, FHM. [facility] Pager [phone]  If 7PM-7AM, please contact night-coverage [email or web address] Password [email or web address] 01/06/2016, 2:54 PM

## 2016-01-06 NOTE — Progress Notes (Addendum)
Patient Name: Anne HusbandsLinda Cannon Date of Encounter: 01/06/2016  Primary Cardiologist:    Dr. Antoine PocheHochrein (new)  Hospital Problem List     Principal Problem:   General weakness Active Problems:   Urinary tract infection without hematuria   Diarrhea   Dehydration   Controlled type 2 diabetes mellitus with hyperglycemia (HCC)   Weakness   Chest pain     Subjective  Admitted with weakness diarrhea nd UTI + TN   [facility] recommended outpt myoview  Denies chest pain. Diarrhea  Is improved  [place]  Inpatient Medications    Scheduled Meds: . enoxaparin (LOVENOX) injection  40 mg Subcutaneous Q24H  . gabapentin  100 mg Oral QHS  . insulin aspart  0-15 Units Subcutaneous TID WC  . insulin glargine  50 Units Subcutaneous BID  . lisinopril  20 mg Oral Daily  . loratadine  10 mg Oral Daily  . metoprolol succinate  100 mg Oral QHS  . zolpidem  5 mg Oral Once   Continuous Infusions:  PRN Meds: hydrALAZINE, loperamide, ondansetron **OR** ondansetron (ZOFRAN) IV   Vital Signs    Vitals:   01/05/16 0909 01/05/16 1500 01/05/16 1930 01/06/16 0600  BP: (!) 151/79 (!) 186/78 (!) 159/73 (!) 181/72  Pulse: 88 76 90 81  Resp: 18 18 20 18   Temp: 97.8 F (36.6 C) 98.4 F (36.9 C) 97.5 F (36.4 C) 97.8 F (36.6 C)  TempSrc: Oral Oral Oral Oral  SpO2: 99% 98% 95% 95%  Weight:      Height:        Intake/Output Summary (Last 24 hours) at 01/06/16 0742 Last data filed at 01/05/16 1700  Gross per 24 hour  Intake              720 ml  Output                0 ml  Net              720 ml   Filed Weights   01/02/16 1723 01/02/16 2347 01/04/16 0623  Weight: 198 lb (89.8 kg) 190 lb 3.2 oz (86.3 kg) 197 lb 3.2 oz (89.4 kg)    Physical Exam    Well developed and nourished in no acute distress HENT normal Neck supple   Clear Regular rate and rhythm, no murmurs or gallops Abd-soft with active BS No Clubbing cyanosis edema Skin-warm and dry A & Oriented  Grossly normal sensory  and motor function   Labs    CBC  Recent Labs  01/04/16 0859  WBC 12.6*  HGB 11.1*  HCT 34.8*  MCV 83.1  PLT 217   Basic Metabolic Panel No results for input(s): NA, K, CL, CO2, GLUCOSE, BUN, CREATININE, CALCIUM, MG, PHOS in the last 72 hours. Liver Function Tests No results for input(s): AST, ALT, ALKPHOS, BILITOT, PROT, ALBUMIN in the last 72 hours. No results for input(s): LIPASE, AMYLASE in the last 72 hours. Cardiac Enzymes  Recent Labs  01/03/16 1157 01/05/16 0716  CKTOTAL  --  274*  TROPONINI 0.05*  --    BNP Invalid input(s): POCBNP D-Dimer No results for input(s): DDIMER in the last 72 hours. Hemoglobin A1C  Recent Labs  01/04/16 0859  HGBA1C 10.0*   Fasting Lipid Panel No results for input(s): CHOL, HDL, LDLCALC, TRIG, CHOLHDL, LDLDIRECT in the last 72 hours. Thyroid Function Tests No results for input(s): TSH, T4TOTAL, T3FREE, THYROIDAB in the last 72 hours.  Invalid input(s): FREET3  Telemetry       ECG       Radiology    No results found.  Cardiac Studies   ECHO - Left ventricle: The cavity size was normal. Systolic function was   normal. The estimated ejection fraction was in the range of 55%   to 60%. Wall motion was normal; there were no regional wall   motion abnormalities. Doppler parameters are consistent with   abnormal left ventricular relaxation (grade 1 diastolic   dysfunction). - Mitral valve: Calcified annulus. - Left atrium: The atrium was mildly dilated.   Assessment & Plan    ELEVATED TROPONIN:  ve a Lexiscan Myoview as an out patient because of her risk factors.   HTN:  DM:  A1C is 10.  Per primary team.   BP remains poorly controlled.>>> will add hct to lisinopril  Will do myoview as ioutpt Will see again as needed  Please alert at time of discharge as [facility]   Signed, Sherryl MangesSteven Satoru Milich, MD  01/06/2016, 7:42 AM

## 2016-01-07 DIAGNOSIS — E1165 Type 2 diabetes mellitus with hyperglycemia: Secondary | ICD-10-CM

## 2016-01-07 DIAGNOSIS — Z794 Long term (current) use of insulin: Secondary | ICD-10-CM

## 2016-01-07 LAB — GLUCOSE, CAPILLARY
GLUCOSE-CAPILLARY: 192 mg/dL — AB (ref 65–99)
GLUCOSE-CAPILLARY: 251 mg/dL — AB (ref 65–99)
GLUCOSE-CAPILLARY: 271 mg/dL — AB (ref 65–99)
Glucose-Capillary: 281 mg/dL — ABNORMAL HIGH (ref 65–99)

## 2016-01-07 MED ORDER — INSULIN ASPART 100 UNIT/ML ~~LOC~~ SOLN
4.0000 [IU] | Freq: Three times a day (TID) | SUBCUTANEOUS | Status: DC
  Administered 2016-01-07 – 2016-01-08 (×3): 4 [IU] via SUBCUTANEOUS

## 2016-01-07 MED ORDER — AMLODIPINE BESYLATE 5 MG PO TABS
5.0000 mg | ORAL_TABLET | Freq: Every day | ORAL | Status: DC
  Administered 2016-01-07 – 2016-01-09 (×3): 5 mg via ORAL
  Filled 2016-01-07 (×2): qty 1

## 2016-01-07 NOTE — Care Management Important Message (Signed)
Important Message  Patient Details  Name: Anne Cannon MRN: [id number] Date of Birth: 1945-10-07   Medicare Important Message Given:  Yes    [name], Cephas Revard Ellen, RN 01/07/2016, 12:29 PM

## 2016-01-07 NOTE — Progress Notes (Signed)
Physical Therapy Treatment Patient Details Name: Anne HusbandsLinda Cannon MRN: [id number] DOB: 1945-03-10 Today's Date: 01/07/2016    History of Present Illness Pt is a 70 y/o female admitted secondary to generalized weakness. PMH including but not limited to HTN and DM. Patient gave history of eating seafood at a restaurant ([employer]) and several hours later started having diarrhea without abdominal pain, nausea or vomiting.     PT Comments    Patient is progressing toward mobility goals however continues to demonstrate generalized weakness and is a high fall risk with history of multiple falls. Pt also demonstrated memory deficits. Pt will 24/7 supervision/assistance and reported that son may not be able to provide the amount of assistance needed. Pt will benefit from ST-SNF for further skilled PT services to maximize independence and safety with mobility.   Follow Up Recommendations  SNF;Supervision/Assistance - 24 hour     Equipment Recommendations  None recommended by PT    Recommendations for Other Services       Precautions / Restrictions Precautions Precautions: Fall Restrictions Weight Bearing Restrictions: No    Mobility  Bed Mobility               General bed mobility comments: pt OOB in chair upon arrival  Transfers Overall transfer level: Needs assistance Equipment used: Rolling walker (2 wheeled);2 person hand held assist Transfers: Sit to/from Stand Sit to Stand: Min guard         General transfer comment: cues for technique and safe hand placement  Ambulation/Gait Ambulation/Gait assistance: Min assist Ambulation Distance (Feet): 75 Feet Assistive device: Rolling walker (2 wheeled) Gait Pattern/deviations: Step-through pattern;Decreased stride length Gait velocity: decreased   General Gait Details: cues for safe use of AD and maintaining bilat hands on RW at all times while ambulating   Stairs Stairs: Yes Stairs assistance: Min assist Stair  Management: Two rails;One rail Left;Forwards;Sideways;Step to pattern Number of Stairs: 2 General stair comments: assist to steady and to weight shift when descending; mod cues for sequencing and hand placement  Wheelchair Mobility    Modified Rankin (Stroke Patients Only)       Balance     Sitting balance-Leahy Scale: Good       Standing balance-Leahy Scale: Fair                      Cognition Arousal/Alertness: Awake/alert Behavior During Therapy: WFL for tasks assessed/performed Overall Cognitive Status: No family/caregiver present to determine baseline cognitive functioning       Memory:  (most likely baseline)              Exercises      General Comments General comments (skin integrity, edema, etc.): pt is very easily distracted       Pertinent Vitals/Pain Pain Assessment: No/denies pain    Home Living                      Prior Function            PT Goals (current goals can now be found in the care plan section) Acute Rehab PT Goals Patient Stated Goal: to get stronger Progress towards PT goals: Progressing toward goals    Frequency    Min 3X/week      PT Plan Current plan remains appropriate    Co-evaluation             End of Session Equipment Utilized During Treatment: Gait belt Activity Tolerance: Patient tolerated treatment well  Patient left: in chair;with call bell/phone within reach;with chair alarm set;with family/visitor present     Time: [id number] PT Time Calculation (min) (ACUTE ONLY): 25 min  Charges:  $Gait Training: 8-22 mins $Therapeutic Activity: 8-22 mins                    G Codes:      Derek MoundKellyn R Ladarrion Telfair Fredrick [place], PTA Pager: [phone]   01/07/2016, 4:49 PM

## 2016-01-07 NOTE — NC FL2 (Addendum)
[place] MEDICAID FL2 LEVEL OF CARE SCREENING TOOL     IDENTIFICATION  Patient Name: Anne Cannon Birthdate: 1946-01-23 Sex: female Admission Date (Current Location): 01/02/2016  [facility] [place]   Facility and Address:  [facility], [address], [place], [place] [address]      Provider Number: [id number]  Attending Physician Name and Address:  Elease EtienneAnand D Hongalgi, MD  Relative Name and Phone Number:  Dtr Rockney Gheelizabeth Buitron [phone]    Current Level of Care: Hospital Recommended Level of Care: Skilled Nursing Facility Prior Approval Number: 01/08/2016  Date Approved/Denied:   PASRR Number: [id number]    Discharge Plan: SNF    Current Diagnoses: Patient Active Problem List   Diagnosis Date Noted  . Hypertension associated with chronic kidney disease due to type 1 diabetes mellitus (HCC)   . Elevated troponin   . Essential hypertension   . Chest pain   . Diarrhea 01/03/2016  . General weakness 01/03/2016  . Dehydration 01/03/2016  . Controlled type 2 diabetes mellitus with hyperglycemia (HCC) 01/03/2016  . Weakness 01/03/2016  . Urinary tract infection without hematuria 01/02/2016    Orientation RESPIRATION BLADDER Height & Weight     Self, Time, Situation, Place  Normal Continent Weight: 197 lb 3.2 oz (89.4 kg) Height:  5\' 4"  (162.6 cm)  BEHAVIORAL SYMPTOMS/MOOD NEUROLOGICAL BOWEL NUTRITION STATUS  Other (Comment) (None)   Continent (S) Diet (See DC Summary)  AMBULATORY STATUS COMMUNICATION OF NEEDS Skin   Limited Assist Verbally Normal                       Personal Care Assistance Level of Assistance  Bathing Bathing Assistance: Limited assistance         Functional Limitations Info             SPECIAL CARE FACTORS FREQUENCY  PT (By licensed PT)     PT Frequency: 5X Wk              Contractures Contractures Info: Not present    Additional Factors Info  Code Status, Allergies  Code Status Info: Full Allergies Info: Acetaminophen, Penicillins           Current Medications (01/07/2016):  This is the current hospital active medication list Current Facility-Administered Medications  Medication Dose Route Frequency Provider Last Rate Last Dose  . enoxaparin (LOVENOX) injection 40 mg  40 mg Subcutaneous Q24H Eduard ClosArshad N Kakrakandy, MD   40 mg at 01/07/16 0856  . gabapentin (NEURONTIN) capsule 100 mg  100 mg Oral QHS Eduard ClosArshad N Kakrakandy, MD   100 mg at 01/06/16 2258  . hydrALAZINE (APRESOLINE) injection 10 mg  10 mg Intravenous Q4H PRN Eduard ClosArshad N Kakrakandy, MD   10 mg at 01/07/16 0657  . hydrochlorothiazide (HYDRODIURIL) tablet 25 mg  25 mg Oral Daily Duke SalviaSteven C Klein, MD   25 mg at 01/07/16 0856  . insulin aspart (novoLOG) injection 0-15 Units  0-15 Units Subcutaneous TID WC Eduard ClosArshad N Kakrakandy, MD   3 Units at 01/07/16 0800  . insulin glargine (LANTUS) injection 50 Units  50 Units Subcutaneous BID Eduard ClosArshad N Kakrakandy, MD   50 Units at 01/07/16 0901  . lisinopril (PRINIVIL,ZESTRIL) tablet 20 mg  20 mg Oral Daily Elease EtienneAnand D Hongalgi, MD   20 mg at 01/07/16 0856  . loperamide (IMODIUM) capsule 2 mg  2 mg Oral BID PRN Elease EtienneAnand D Hongalgi, MD      . loratadine (CLARITIN) tablet  10 mg  10 mg Oral Daily Eduard ClosArshad N Kakrakandy, MD   10 mg at 01/07/16 0903  . metoprolol succinate (TOPROL-XL) 24 hr tablet 100 mg  100 mg Oral QHS Eduard ClosArshad N Kakrakandy, MD   100 mg at 01/06/16 2258  . ondansetron (ZOFRAN) tablet 4 mg  4 mg Oral Q6H PRN Eduard ClosArshad N Kakrakandy, MD       Or  . ondansetron [facility](ZOFRAN) injection 4 mg  4 mg Intravenous Q6H PRN Eduard ClosArshad N Kakrakandy, MD      . zolpidem (AMBIEN) tablet 5 mg  5 mg Oral Once Leanne ChangKatherine P Schorr, NP         Discharge Medications: Please see discharge summary for a list of discharge medications.  Relevant Imaging Results:  Relevant Lab Results:   Additional Information    BROWN, SHELBY B, LCSWA

## 2016-01-07 NOTE — Clinical Social Work Note (Signed)
Clinical Social Work Assessment  Patient Details  Name: Anne Cannon MRN: [id number] Date of Birth: [birth year]  Date of referral:  01/07/16               Reason for consult:  Facility Placement, Discharge Planning                Permission sought to share information with:  Case Manager, Customer service manager, Family Supports Permission granted to share information::  Yes, Verbal Permission Granted  Name::        Agency::     Relationship::     Contact Information:     Housing/Transportation Living arrangements for the past 2 months:  Single Family Home Source of Information:  Patient, Adult Children Patient Interpreter Needed:  None Criminal Activity/Legal Involvement Pertinent to Current Situation/Hospitalization:  No - Comment as needed Significant Relationships:  Adult Children Lives with:  Self, Adult Children Do you feel safe going back to the place where you live?  No Need for family participation in patient care:  Yes (Comment)  Care giving concerns:  Pt lives with adult son who has medical conditions of his own and cannot provide necessary care pt needs as she rehabs. Physical therapy recommendation is SNF.  Social Worker assessment / plan:  CSW met with pt and family at bedside. Pt pleasant, alert and oriented X4. Pt had already discussed SNF with physical therapy and is on board. Pt lives with adult son and understands that being safe and getting stronger is priority. Pt lives in [place] and pt's first choice is to be placed in [place], if no placement in [place] pt willing to go to [place], [place]. CSW will work on placement and coordinate a safe DC for pt to go to SNF.  Employment status:  Retired Forensic scientist:  Medicare PT Recommendations:  [place] / Referral to community resources:     Patient/Family's Response to care:  Pt/family very appreciative of care received from hospital and CSW involvement in discharge planning.  Patient/Family's  Understanding of and Emotional Response to Diagnosis, Current Treatment, and Prognosis:  Pt aware that rehab is essential to her getting back to her baseline.   Emotional Assessment Appearance:  Appears stated age Attitude/Demeanor/Rapport:  Other (Pleasant) Affect (typically observed):  Accepting Orientation:  Oriented to Self, Oriented to Place, Oriented to  Time, Oriented to Situation Alcohol / Substance use:  Not Applicable Psych involvement (Current and /or in the community):  No (Comment)  Discharge Needs  Concerns to be addressed:  Home Safety Concerns Readmission within the last 30 days:  No Current discharge risk:  None Barriers to Discharge:  Continued Medical Work up   [place], [place], [place] 01/07/2016, 11:20 AM

## 2016-01-07 NOTE — Care Management Note (Addendum)
Case Management Note  Patient Details  Name: Anne Cannon MRN: [id number] Date of Birth: 12-03-1945  Subjective/Objective:      UTI, general weakness              Action/Plan: Discharge Planning:  Chart reviewed. CSW following for SNF placement.   Expected Discharge Date:                Expected Discharge Plan:  Skilled Nursing Facility  In-House Referral:  Clinical Social Work  Discharge planning Services  CM Consult  Post Acute Care Choice:  NA Choice offered to:  NA  DME Arranged:  N/A DME Agency:  NA  HH Arranged:  NA HH Agency:  NA  Status of Service:  Completed, signed off  If discussed at Long Length of Stay Meetings, dates discussed:    Additional Comments:  [name], [name], RN 01/07/2016, 11:00 AM

## 2016-01-07 NOTE — Progress Notes (Signed)
Inpatient Diabetes Program Recommendations  AACE/ADA: New Consensus Statement on Inpatient Glycemic Control (2015)  Target Ranges:  Prepandial:   less than 140 mg/dL      Peak postprandial:   less than 180 mg/dL (1-2 hours)      Critically ill patients:  140 - 180 mg/dL   Lab Results  Component Value Date   GLUCAP 192 (H) 01/07/2016   HGBA1C 10.0 (H) 01/04/2016    Review of Glycemic ControlResults for Anne HusbandsVANS, Anne Cannon (MRN [id number]) as of 01/07/2016 10:27  Ref. Range 01/06/2016 06:11 01/06/2016 12:00 01/06/2016 16:36 01/06/2016 21:19 01/07/2016 06:56  Glucose-Capillary Latest Ref Range: 65 - 99 mg/dL 643111 (H) 329158 (H) 518246 (H) 274 (H) 192 (H)    Inpatient Diabetes Program Recommendations:    Consider adding Novolog 4 units tid with meals (hold if patient eats less than 50%).  Thanks, Beryl MeagerJenny Andrell Tallman, RN, BC-ADM Inpatient Diabetes Coordinator Pager [phone] (8a-5p)

## 2016-01-07 NOTE — Clinical Social Work Placement (Addendum)
   CLINICAL SOCIAL WORK PLACEMENT  NOTE  Date:  01/07/2016  Patient Details  Name: Graciela HusbandsLinda Placide MRN: [id number] Date of Birth: 1945-08-01  Clinical Social Work is seeking post-discharge placement for this patient at the Skilled  Nursing Facility level of care (*CSW will initial, date and re-position this form in  chart as items are completed):  Yes   Patient/family provided with [facility] Social Work Department's list of facilities offering this level of care within the geographic area requested by the patient (or if unable, by the patient's family).  Yes   Patient/family informed of their freedom to choose among providers that offer the needed level of care, that participate in Medicare, Medicaid or managed care program needed by the patient, have an available bed and are willing to accept the patient.  No   Patient/family informed of [facility] ownership interest in [facility] and [facility], as well as of the fact that they are under no obligation to receive care at these facilities.  PASRR submitted to EDS on 01/07/16     PASRR number received on       Existing PASRR number confirmed on       FL2 transmitted to all facilities in geographic area requested by pt/family on       FL2 transmitted to all facilities within larger geographic area on 01/07/16     Patient informed that his/her managed care company has contracts with or will negotiate with certain facilities, including the following:            Patient/family informed of bed offers received.  Patient chooses bed at       Physician recommends and patient [facility].    [facility] .  [facility]     Patient family notified on 01/08/16 of transfer.  Name of family member notified:  JR.     PHYSICIAN Please sign FL2     Additional Comment:     _______________________________________________ Norlene DuelBROWN, SHELBY B, LCSWA 01/07/2016, 11:28 AM

## 2016-01-07 NOTE — Progress Notes (Signed)
PROGRESS NOTE    Anne HusbandsLinda Birman  HYQ:[id number] DOB: [birth year] DOA: 01/02/2016 PCP: No PCP Per Patient   Brief Narrative: Anne Cannon is a 70 y.o. female with hypertension and diabetes mellitus 2. She presented with weakness and had a UA suggestive of UTI (with symptoms) and associated elevated troponin. Cardiology following. Symptoms improving. Patient gave history of eating seafood at a restaurant ([employer]) and several hours later started having diarrhea without abdominal pain, nausea or vomiting. Denies other family with same symptoms. Cardiology does not think that she has ACS and recommend outpatient Myoview. Clinically improved and stable for discharge but awaiting SNF. At discharge, cardiology to be contacted to arrange [place] [facility] [place] [facility].  Assessment & Plan:   Principal Problem:   General weakness Active Problems:   Urinary tract infection without hematuria   Diarrhea   Dehydration   Controlled type 2 diabetes mellitus with hyperglycemia (HCC)   Weakness   Chest pain   Hypertension associated with chronic kidney disease due to type 1 diabetes mellitus (HCC)   Elevated troponin   Essential hypertension   Generalized weakness Likely secondary to acute medical illness including diarrhea, associated dehydration, mild acute kidney injury and rhabdomyolysis. Clinically improved. Physical therapy evaluated 11/22 and recommend SNF. OT evaluated today and recommend SNF. Low index of suspicion for PMR or other rheumatic etiologies. Echo shows normal EF. Awaiting SNF-discussed with clinical social worker on 11/24, less likely to happen today due to holidays and [facility].  Chest discomfort/elevated troponin Cardiology consultation and follow-up appreciated. Inconsistent history. Cardiology does not suspect ACS and recommend outpatient Lexiscan Myoview because of her risk factors. Chest pain resolved. CTA chest: No PE. Cardiology signed off.  Contact cardiology prior to discharge to arrange for [place] [facility].  Hypertension Uncontrolled. Lisinopril which was held on admission secondary to possible AKI and subsequently resumed, metoprolol was continued. Due to persistent high blood pressure, lisinopril was increased from 10 MG to 20 MG daily on 11/23 and HCTZ was added by cardiology. Continue when necessary IV hydralazine. Blood pressures remain elevated. Add amlodipine 5 MG daily.  Diabetes mellitus, type 2 -continue sliding scale insulin - A1C: 10 suggesting very poor outpatient control.Fluctuating and mildly uncontrolled CBGs over the last 24 hours. Continue Lantus and NovoLog SSI which may need adjustment as outpatient. Add mealtime NovoLog 4 units.  Diarrhea C. Difficile and GI pathogen panel negative. Patient states that her diarrhea started after eating out at a seafood restaurant. Diarrhea had somewhat improved but again had 2 episodes of loose stools on 11/22. Trial of Imodium. Suspicious for food poisoning versus acute viral GE.  Resolved.  UTI Possibly contributing to presentation - Completed 3 doses of IV Rocephin and completed course. Discontinued Rocephin .  AKI Resolved.  Mild rhabdomyolysis - Possibly from laying in the bathtub for several hours. Presented with CK of 1154. Improved with CK 274.  Anemia - Stable. Outpatient follow-up.   DVT prophylaxis: Lovenox Code Status: Full code Family Communication: none at bedside Disposition Plan: PT is recommending SNF. Consult clinical Child psychotherapistsocial worker. Stable for DC to SNF when bed available.  Consultants:   Cardiology-signed off  Procedures:   2-D echo 01/04/16: Study Conclusions  - Left ventricle: The cavity size was normal. Systolic function was   normal. The estimated ejection fraction was in the range of 55%   to 60%. Wall motion was normal; there were no regional wall   motion abnormalities. Doppler parameters  are  consistent with   abnormal left ventricular relaxation (grade 1 diastolic   dysfunction). - Mitral valve: Calcified annulus. - Left atrium: The atrium was mildly dilated.  Impressions:  - Technically difficult; definity used; normal LV systolic   function; grade 1 diastolic dysfunction; mild LAE.  Antimicrobials:  Ceftriaxone (11/19>> 11/21    Subjective: Denies complaints. No further diarrhea for 2 days. No abdominal pain or distention. No nausea or vomiting. Tolerating diet. Feels stronger. As per RN, no acute issues.  Objective: Vitals:   01/06/16 1545 01/06/16 1800 01/06/16 2113 01/07/16 0614  BP: (!) 184/80 (!) 149/79 (!) 194/81 (!) 176/77  Pulse: 93  99 87  Resp: 18  17 17   Temp: 97.9 F (36.6 C)  99.3 F (37.4 C) 97.3 F (36.3 C)  TempSrc:   Oral Oral  SpO2: 96%  95% 96%  Weight:      Height:        Intake/Output Summary (Last 24 hours) at 01/07/16 1209 Last data filed at 01/07/16 1100  Gross per 24 hour  Intake             1080 ml  Output                0 ml  Net             1080 ml   Filed Weights   01/02/16 1723 01/02/16 2347 01/04/16 0623  Weight: 89.8 kg (198 lb) 86.3 kg (190 lb 3.2 oz) 89.4 kg (197 lb 3.2 oz)    Examination:  General exam: Appears calm and comfortable Respiratory system: Clear to auscultation. Respiratory effort normal. Cardiovascular system: S1 & S2 heard, RRR. No murmurs, rubs, gallops or clicks. Gastrointestinal system: Abdomen is obese, soft and nontender. Normal bowel sounds heard. Central nervous system: Alert and oriented. No focal neurological deficits. Extremities: No edema. No calf tenderness Skin: No cyanosis. No rashes Psychiatry: Judgement and insight appear impaired. Mood & affect appropriate.     Data Reviewed: I have personally reviewed following labs and imaging studies  CBC:  Recent Labs Lab 01/02/16 1727 01/03/16 0707 01/04/16 0859  WBC 19.9* 16.1* 12.6*  NEUTROABS  --  10.9*  --   HGB 12.9 11.0*  11.1*  HCT 39.0 34.6* 34.8*  MCV 81.8 82.4 83.1  PLT 283 217 217   Basic Metabolic Panel:  Recent Labs Lab 01/02/16 1727 01/03/16 0707  NA 137 136  K 4.0 3.6  CL 99* 103  CO2 24 23  GLUCOSE 422* 349*  BUN 23* 16  CREATININE 1.13* 0.89  CALCIUM 9.5 8.4*   GFR: Estimated Creatinine Clearance: 63.7 mL/min (by C-G formula based on SCr of 0.89 mg/dL). Liver Function Tests:  Recent Labs Lab 01/02/16 1727 01/03/16 0707  AST 61* 47*  ALT 69* 50  ALKPHOS 59 47  BILITOT 0.8 0.6  PROT 8.1 6.1*  ALBUMIN 4.0 3.2*    Recent Labs Lab 01/02/16 1727  LIPASE 21   No results for input(s): AMMONIA in the last 168 hours. Coagulation Profile: No results for input(s): INR, PROTIME in the last 168 hours. Cardiac Enzymes:  Recent Labs Lab 01/03/16 0044 01/03/16 0707 01/03/16 1157 01/05/16 0716  CKTOTAL  --  1,154*  --  274*  TROPONINI 0.09* 0.10* 0.05*  --    BNP (last 3 results) No results for input(s): PROBNP in the last 8760 hours. HbA1C: No results for input(s): HGBA1C in the last 72 hours. CBG:  Recent Labs Lab 01/06/16 1200 01/06/16  1636 01/06/16 2119 01/07/16 0656 01/07/16 1124  GLUCAP 158* 246* 274* 192* 251*   Lipid Profile: No results for input(s): CHOL, HDL, LDLCALC, TRIG, CHOLHDL, LDLDIRECT in the last 72 hours. Thyroid Function Tests: No results for input(s): TSH, T4TOTAL, FREET4, T3FREE, THYROIDAB in the last 72 hours. Anemia Panel: No results for input(s): VITAMINB12, FOLATE, FERRITIN, TIBC, IRON, RETICCTPCT in the last 72 hours. Sepsis Labs: No results for input(s): PROCALCITON, LATICACIDVEN in the last 168 hours.  Recent Results (from the past 240 hour(s))  Gastrointestinal Panel by PCR , Stool     Status: None   Collection Time: 01/03/16 12:15 AM  Result Value Ref Range Status   Campylobacter species NOT DETECTED NOT DETECTED Final   Plesimonas shigelloides NOT DETECTED NOT DETECTED Final   Salmonella species NOT DETECTED NOT DETECTED  Final   Yersinia enterocolitica NOT DETECTED NOT DETECTED Final   Vibrio species NOT DETECTED NOT DETECTED Final   Vibrio cholerae NOT DETECTED NOT DETECTED Final   Enteroaggregative E coli (EAEC) NOT DETECTED NOT DETECTED Final   Enteropathogenic E coli (EPEC) NOT DETECTED NOT DETECTED Final   Enterotoxigenic E coli (ETEC) NOT DETECTED NOT DETECTED Final   Shiga like toxin producing E coli (STEC) NOT DETECTED NOT DETECTED Final   Shigella/Enteroinvasive E coli (EIEC) NOT DETECTED NOT DETECTED Final   Cryptosporidium NOT DETECTED NOT DETECTED Final   Cyclospora cayetanensis NOT DETECTED NOT DETECTED Final   Entamoeba histolytica NOT DETECTED NOT DETECTED Final   Giardia lamblia NOT DETECTED NOT DETECTED Final   Adenovirus F40/41 NOT DETECTED NOT DETECTED Final   Astrovirus NOT DETECTED NOT DETECTED Final   Norovirus GI/GII NOT DETECTED NOT DETECTED Final   Rotavirus A NOT DETECTED NOT DETECTED Final   Sapovirus (I, II, IV, and V) NOT DETECTED NOT DETECTED Final  C difficile quick scan w PCR reflex     Status: None   Collection Time: 01/04/16  6:27 AM  Result Value Ref Range Status   C Diff antigen NEGATIVE NEGATIVE Final   C Diff toxin NEGATIVE NEGATIVE Final   C Diff interpretation No C. difficile detected.  Final         Radiology Studies: No results found.      Scheduled Meds: . enoxaparin (LOVENOX) injection  40 mg Subcutaneous Q24H  . gabapentin  100 mg Oral QHS  . hydrochlorothiazide  25 mg Oral Daily  . insulin aspart  0-15 Units Subcutaneous TID WC  . insulin glargine  50 Units Subcutaneous BID  . lisinopril  20 mg Oral Daily  . loratadine  10 mg Oral Daily  . metoprolol succinate  100 mg Oral QHS  . zolpidem  5 mg Oral Once   Continuous Infusions:   LOS: 4 days     Kynslee Baham, MD, FACP, FHM. [facility] Pager [phone]  If 7PM-7AM, please contact night-coverage [email or web address] Password [email or web address] 01/07/2016, 12:09 PM

## 2016-01-07 NOTE — Progress Notes (Signed)
Occupational Therapy Evaluation Patient Details Name: Anne HusbandsLinda Prather MRN: [id number] DOB: 1945-10-01 Today's Date: 01/07/2016    History of Present Illness Pt is a 70 y/o female admitted secondary to generalized weakness. PMH including but not limited to HTN and DM. Patient gave history of eating seafood at a restaurant ([employer]) and several hours later started having diarrhea without abdominal pain, nausea or vomiting.    Clinical Impression   PTA, pt lived with son but was mod I with mobility and required min A with ADL at times. Completed ADL with pt. Pt very slow and is at high risk for falls. Pt unable to complete hygiene independently at this time and would need 24/7 S, in addition to assist for hygiene after toileting. Pt states her son is unable to provide this level of assistance. Pt also has history of falls and feel that pt would benefit from rehab at SNF prior to return home. Pt also appears to have impairment with attention, safety awareness/ judgement/reasoning.  Pt in agreement to rehab at SNF. Will follow acutely to facilitate safe DC to next venue of care.     Follow Up Recommendations  SNF;Supervision/Assistance - 24 hour    Equipment Recommendations  3 in 1 bedside comode;Tub/shower bench    Recommendations for Other Services       Precautions / Restrictions Precautions Precautions: Fall      Mobility Bed Mobility Overal bed mobility: Needs Assistance Bed Mobility: Supine to Sit     Supine to sit: HOB elevated;Supervision     General bed mobility comments: Pt asking OT to help pull her up. Staes it sometimes takes her an hour to get OOB  Transfers Overall transfer level: Needs assistance   Transfers: Sit to/from BJ'sStand;Stand Pivot Transfers Sit to Stand: Min guard Stand pivot transfers: Min guard            Balance Overall balance assessment: History of Falls;Needs assistance   Sitting balance-Leahy Scale: Good       Standing balance-Leahy  Scale: Fair                              ADL Overall ADL's : Needs assistance/impaired     Grooming: Set up   Upper Body Bathing: Set up;Sitting   Lower Body Bathing: Moderate assistance;Sit to/from stand   Upper Body Dressing : Set up;Sitting   Lower Body Dressing: Moderate assistance;Sit to/from stand   Toilet Transfer: Min guard;Ambulation;RW;BSC (over toilet)   Toileting- Clothing Manipulation and Hygiene: Moderate assistance;Sit to/from stand Toileting - Clothing Manipulation Details (indicate cue type and reason): pt unable to complete hygiene after toileting. states she usually balances on 1 foot to clean herself     Functional mobility during ADLs: Min guard;Rolling walker;Cueing for safety General ADL Comments: Easily distracted. forgetful. Multiple cues to maintain attention to task     Vision     Perception     Praxis      Pertinent Vitals/Pain Pain Assessment: No/denies pain     Hand Dominance Right   Extremity/Trunk Assessment Upper Extremity Assessment Upper Extremity Assessment: Generalized weakness   Lower Extremity Assessment Lower Extremity Assessment: Defer to PT evaluation   Cervical / Trunk Assessment Cervical / Trunk Assessment: Kyphotic   Communication Communication Communication: No difficulties   Cognition Arousal/Alertness: Awake/alert Behavior During Therapy: WFL for tasks assessed/performed Overall Cognitive Status: No family/caregiver present to determine baseline cognitive functioning       Memory:  (  most likely baseline)  Impaired judgement - gets in tub at home when she knows she can't get out Distracted throughout session Left water running in sink until therapist cued pt to turn it off vc throughout task to maintain attention             General Comments       Exercises       Shoulder Instructions      Home Living Family/patient expects to be discharged to:: Private residence Living  Arrangements: Children Available Help at Discharge: Family;Available PRN/intermittently;Available 24 hours/day (unsure) Type of Home: House Home Access: Stairs to enter Entergy CorporationEntrance Stairs-Number of Steps: 2 Entrance Stairs-Rails: Right;Left Home Layout: One level     Bathroom Shower/Tub: Chief Strategy OfficerTub/shower unit   Bathroom Toilet: Standard Bathroom Accessibility: Yes How Accessible: Accessible via walker Home Equipment: Cane - single point;Shower seat          Prior Functioning/Environment Level of Independence: Independent with assistive device(s)        Comments: pt stated that she ambulated with use of SPC        OT Problem List: Decreased strength;Decreased range of motion;Decreased activity tolerance;Impaired balance (sitting and/or standing);Decreased cognition;Decreased safety awareness;Decreased knowledge of use of DME or AE;Obesity;Impaired UE functional use   OT Treatment/Interventions: Self-care/ADL training;Therapeutic exercise;DME and/or AE instruction;Therapeutic activities;Cognitive remediation/compensation;Patient/family education;Balance training    OT Goals(Current goals can be found in the care plan section) Acute Rehab OT Goals Patient Stated Goal: to get stronger OT Goal Formulation: With patient Time For Goal Achievement: 01/21/16 Potential to Achieve Goals: Good ADL Goals Pt Will Perform Lower Body Bathing: with supervision;sit to/from stand;with adaptive equipment Pt Will Perform Lower Body Dressing: with supervision;sit to/from stand;with adaptive equipment Pt Will Perform Toileting - Clothing Manipulation and hygiene: with modified independence;sit to/from stand;with adaptive equipment Pt Will Perform Tub/Shower Transfer: with min guard assist;tub bench;rolling walker;ambulating  OT Frequency: Min 2X/week   Barriers to D/C:            Co-evaluation              End of Session Equipment Utilized During Treatment: Gait belt;Rolling walker Nurse  Communication: Mobility status  Activity Tolerance: Patient tolerated treatment well Patient left: in chair;with call bell/phone within reach;with chair alarm set   Time: 0933-1010 OT Time Calculation (min): 37 min Charges:  OT General Charges $OT Visit: 1 Procedure OT Evaluation $OT Eval Moderate Complexity: 1 Procedure OT Treatments $Self Care/Home Management : 8-22 mins G-Codes:    Ariahna Smiddy,HILLARY 01/07/2016, 10:34 AM   Luisa DagoHilary Raziah Funnell, OTR/L  [phone] 01/07/2016

## 2016-01-08 ENCOUNTER — Inpatient Hospital Stay (HOSPITAL_COMMUNITY): Payer: Medicare Other

## 2016-01-08 ENCOUNTER — Other Ambulatory Visit: Payer: Self-pay | Admitting: Cardiology

## 2016-01-08 DIAGNOSIS — R7989 Other specified abnormal findings of blood chemistry: Principal | ICD-10-CM

## 2016-01-08 DIAGNOSIS — R778 Other specified abnormalities of plasma proteins: Secondary | ICD-10-CM

## 2016-01-08 LAB — GLUCOSE, CAPILLARY
GLUCOSE-CAPILLARY: 257 mg/dL — AB (ref 65–99)
Glucose-Capillary: 201 mg/dL — ABNORMAL HIGH (ref 65–99)
Glucose-Capillary: 223 mg/dL — ABNORMAL HIGH (ref 65–99)
Glucose-Capillary: 216 mg/dL — ABNORMAL HIGH (ref 65–99)

## 2016-01-08 MED ORDER — LOPERAMIDE HCL 2 MG PO CAPS: 2.0000 mg | ORAL_CAPSULE | Freq: Two times a day (BID) | ORAL | Status: AC | PRN

## 2016-01-08 MED ORDER — INSULIN ASPART 100 UNIT/ML ~~LOC~~ SOLN: 6.0000 [IU] | Freq: Three times a day (TID) | SUBCUTANEOUS | Status: AC

## 2016-01-08 MED ORDER — HYDROCHLOROTHIAZIDE 25 MG PO TABS: 25.0000 mg | ORAL_TABLET | Freq: Every day | ORAL | Status: AC

## 2016-01-08 MED ORDER — INSULIN ASPART 100 UNIT/ML ~~LOC~~ SOLN
6.0000 [IU] | Freq: Three times a day (TID) | SUBCUTANEOUS | Status: DC
  Administered 2016-01-08 – 2016-01-09 (×3): 6 [IU] via SUBCUTANEOUS

## 2016-01-08 MED ORDER — AMLODIPINE BESYLATE 5 MG PO TABS: 5.0000 mg | ORAL_TABLET | Freq: Every day | ORAL | Status: AC

## 2016-01-08 MED ORDER — LISINOPRIL 10 MG PO TABS: 20.0000 mg | ORAL_TABLET | Freq: Every day | ORAL | Status: AC

## 2016-01-08 MED ORDER — INSULIN ASPART 100 UNIT/ML ~~LOC~~ SOLN: 0.0000 [IU] | Freq: Three times a day (TID) | SUBCUTANEOUS | Status: AC

## 2016-01-08 NOTE — Progress Notes (Addendum)
PROGRESS NOTE    Anne HusbandsLinda Cannon  WUJ:[id number] DOB: Apr 25, 1945 DOA: 01/02/2016 PCP: No PCP Per Patient   Brief Narrative: Anne HusbandsLinda Cannon is a 70 y.o. femalewith hypertension and diabetes mellitus 2. She presented with weakness and had a UA suggestive of UTI (with symptoms) and associated elevated troponin. Patient gave history of eating seafood at a restaurant ([employer]) and several hours later started having diarrhea without abdominal pain, nausea or vomiting. Denies other family with same symptoms. Cardiology does not think that she has ACS and recommend outpatient Myoview.   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 Clinically improved. Physical therapy evaluated 11/22 and recommend SNF. OT evaluated today and recommend SNF. Low index of suspicion for PMR or other rheumatic etiologies. Echo shows normal EF. Awaiting SNF-discussed with clinical social worker on 11/25> patient is unable to go until 11/27 due to awaiting [facility].  Chest discomfort/elevated troponin Cardiology consultation and follow-up appreciated. Inconsistent history. Cardiology does not suspect ACS and recommend outpatient Lexiscan Myoview because of her risk factors. Chest pain resolved. CTA chest: No PE. Cardiology signed off. Contacted cardiology 11[place]25 [place]  Hypertension Uncontrolled. Lisinopril which was held on admission secondary to possible AKI and  subsequently resumed, metoprolol was continued. Due to persistent high blood pressure, lisinopril was increased from 10 MG to 20 MG daily on 11/23 and HCTZ was added by cardiology. Continue when necessary IV hydralazine. Blood pressures remain elevated. Added amlodipine 5 MG daily. better  Diabetes mellitus, type 2 -continue sliding scale insulin - A1C: 10 suggesting very poor outpatient control. Uncontrolled. Continue Lantus and NovoLog SSI which may need adjustment as outpatient. Increase mealtime NovoLog 6 units.  Diarrhea C. Difficile and GI pathogen panel negative. Patient states that her diarrhea started after eating out at a seafood restaurant. Suspicious for food poisoning versus acute viral GE.  Diarrhea has improved.  UTI Possibly contributing to presentation - Completed 3 doses of IV Rocephin and completed course. Discontinued Rocephin .  AKI Resolved.  Mild rhabdomyolysis - Possibly from laying in the bathtub for several hours. Presented with CK of 1154. Improved with CK 274.  Anemia - Stable. Outpatient follow-up.  Abdominal distention, chronic - As per discussion with patient on several occasions and from discussion with son today, patient has chronically protuberant abdomen and she denies any recent worsening. In fact she states that her abdominal distention is better than at home. She denies abdominal pain, nausea or vomiting. KUB 11/25 shows no acute findings and nonobstructive bowel gas pattern. Clinical examination suggests gaseous distention and not ascites but we'll obtain abdominal ultrasound to look for ascites.   DVT prophylaxis: Lovenox Code Status: Full code Family Communication:  discussed with patient's son on 11/25 Disposition Plan: PT is recommending SNF. Consult clinical Child psychotherapistsocial worker. Stable for DC to SNF when bed available. Patient will benefit from less than 30 days of short term SNF at DC.   Consultants:   Cardiology-signed  off  Procedures:   2-D  echo 01/04/16: Study Conclusions  - Left ventricle: The cavity size was normal. Systolic function was   normal. The estimated ejection fraction was in the range of 55%   to 60%. Wall motion was normal; there were no regional wall   motion abnormalities. Doppler parameters are consistent with   abnormal left ventricular relaxation (grade 1 diastolic   dysfunction). - Mitral valve: Calcified annulus. - Left atrium: The atrium was mildly dilated.  Impressions:  - Technically difficult; definity used; normal LV systolic   function; grade 1 diastolic dysfunction; mild LAE.  Antimicrobials:  Ceftriaxone (11/19>> 11/21    Subjective: Denies complaints. She denies diarrhea but RN is not sure. Patient is a poor historian. Patient states that she ambulated with help of assistance. As per discussion with son today, she does not do much, basically sits and watches TV,? Compliance issues with medications and corroborates with chronic protuberant abdomen.  Objective: Vitals:   01/07/16 0614 01/07/16 1300 01/07/16 2127 01/08/16 0635  BP: (!) 176/77 (!) 146/64 (!) 154/60 (!) 148/67  Pulse: 87 96 96 88  Resp: 17 16 16 16   Temp: 97.3 F (36.3 C) 98 F (36.7 C) 98.5 F (36.9 C) 98.3 F (36.8 C)  TempSrc: Oral Oral Oral Oral  SpO2: 96% 99% 96% 98%  Weight:      Height:       No intake or output data in the 24 hours ending 01/08/16 1345 Filed Weights   01/02/16 1723 01/02/16 2347 01/04/16 0623  Weight: 89.8 kg (198 lb) 86.3 kg (190 lb 3.2 oz) 89.4 kg (197 lb 3.2 oz)    Examination:  General exam: Appears calm and comfortable Respiratory system: Clear to auscultation. Respiratory effort normal. Cardiovascular system: S1 & S2 heard, RRR. No murmurs, rubs, gallops or clicks. Gastrointestinal system: Abdomen is obese/Protuberant, soft and nontender. Tympanitic to percussion. No clinical ascites. Normal bowel sounds heard. Central nervous system: Alert and  oriented. No focal neurological deficits. Extremities: No edema. No calf tenderness Skin: No cyanosis. No rashes Psychiatry: Judgement and insight appear impaired. Mood & affect appropriate.     Data Reviewed: I have personally reviewed following labs and imaging studies  CBC:  Recent Labs Lab 01/02/16 1727 01/03/16 0707 01/04/16 0859  WBC 19.9* 16.1* 12.6*  NEUTROABS  --  10.9*  --   HGB 12.9 11.0* 11.1*  HCT 39.0 34.6* 34.8*  MCV 81.8 82.4 83.1  PLT 283 217 217   Basic Metabolic Panel:  Recent Labs Lab 01/02/16 1727 01/03/16 0707  NA 137 136  K 4.0 3.6  CL 99* 103  CO2 24 23  GLUCOSE 422* 349*  BUN 23* 16  CREATININE 1.13* 0.89  CALCIUM 9.5 8.4*   GFR: Estimated Creatinine Clearance: 63.7 mL/min (by C-G formula based on SCr of 0.89 mg/dL). Liver Function Tests:  Recent Labs Lab 01/02/16 1727 01/03/16 0707  AST 61* 47*  ALT 69* 50  ALKPHOS 59 47  BILITOT 0.8 0.6  PROT 8.1 6.1*  ALBUMIN 4.0 3.2*    Recent Labs Lab 01/02/16 1727  LIPASE 21   No results for input(s): AMMONIA in the last 168 hours. Coagulation Profile: No results for input(s): INR, PROTIME in the last 168 hours. Cardiac Enzymes:  Recent Labs Lab 01/03/16 0044 01/03/16 0707 01/03/16 1157 01/05/16 0716  CKTOTAL  --  1,154*  --  274*  TROPONINI 0.09* 0.10* 0.05*  --    BNP (last 3 results) No results for input(s): PROBNP in the last 8760 hours. HbA1C:  No results for input(s): HGBA1C in the last 72 hours. CBG:  Recent Labs Lab 01/07/16 1124 01/07/16 1630 01/07/16 2127 01/08/16 0639 01/08/16 1203  GLUCAP 251* 281* 271* 201* 223*   Lipid Profile: No results for input(s): CHOL, HDL, LDLCALC, TRIG, CHOLHDL, LDLDIRECT in the last 72 hours. Thyroid Function Tests: No results for input(s): TSH, T4TOTAL, FREET4, T3FREE, THYROIDAB in the last 72 hours. Anemia Panel: No results for input(s): VITAMINB12, FOLATE, FERRITIN, TIBC, IRON, RETICCTPCT in the last 72 hours. Sepsis  Labs: No results for input(s): PROCALCITON, LATICACIDVEN in the last 168 hours.  Recent Results (from the past 240 hour(s))  Gastrointestinal Panel by PCR , Stool     Status: None   Collection Time: 01/03/16 12:15 AM  Result Value Ref Range Status   Campylobacter species NOT DETECTED NOT DETECTED Final   Plesimonas shigelloides NOT DETECTED NOT DETECTED Final   Salmonella species NOT DETECTED NOT DETECTED Final   Yersinia enterocolitica NOT DETECTED NOT DETECTED Final   Vibrio species NOT DETECTED NOT DETECTED Final   Vibrio cholerae NOT DETECTED NOT DETECTED Final   Enteroaggregative E coli (EAEC) NOT DETECTED NOT DETECTED Final   Enteropathogenic E coli (EPEC) NOT DETECTED NOT DETECTED Final   Enterotoxigenic E coli (ETEC) NOT DETECTED NOT DETECTED Final   Shiga like toxin producing E coli (STEC) NOT DETECTED NOT DETECTED Final   Shigella/Enteroinvasive E coli (EIEC) NOT DETECTED NOT DETECTED Final   Cryptosporidium NOT DETECTED NOT DETECTED Final   Cyclospora cayetanensis NOT DETECTED NOT DETECTED Final   Entamoeba histolytica NOT DETECTED NOT DETECTED Final   Giardia lamblia NOT DETECTED NOT DETECTED Final   Adenovirus F40/41 NOT DETECTED NOT DETECTED Final   Astrovirus NOT DETECTED NOT DETECTED Final   Norovirus GI/GII NOT DETECTED NOT DETECTED Final   Rotavirus A NOT DETECTED NOT DETECTED Final   Sapovirus (I, II, IV, and V) NOT DETECTED NOT DETECTED Final  C difficile quick scan w PCR reflex     Status: None   Collection Time: 01/04/16  6:27 AM  Result Value Ref Range Status   C Diff antigen NEGATIVE NEGATIVE Final   C Diff toxin NEGATIVE NEGATIVE Final   C Diff interpretation No C. difficile detected.  Final         Radiology Studies: Dg Abd Portable 1v  Result Date: 01/08/2016 CLINICAL DATA:  Abdominal distension, fever, nausea and diarrhea x1 week. Hx of DM, UTI. EXAM: PORTABLE ABDOMEN - 1 VIEW COMPARISON:  None. FINDINGS: Overall bowel gas pattern is  nonobstructive. No evidence of soft tissue mass or abnormal fluid collection. No evidence of free intraperitoneal air. Cholecystectomy clips noted in the right upper quadrant. Lung bases are clear. No acute or suspicious osseous finding. IMPRESSION: No acute findings.  Nonobstructive bowel gas pattern. Electronically Signed   By: Bary RichardStan  Maynard M.D.   On: 01/08/2016 11:57        Scheduled Meds: . amLODipine  5 mg Oral Daily  . enoxaparin (LOVENOX) injection  40 mg Subcutaneous Q24H  . gabapentin  100 mg Oral QHS  . hydrochlorothiazide  25 mg Oral Daily  . insulin aspart  0-15 Units Subcutaneous TID WC  . insulin aspart  4 Units Subcutaneous TID WC  . insulin glargine  50 Units Subcutaneous BID  . lisinopril  20 mg Oral Daily  . loratadine  10 mg Oral Daily  . metoprolol succinate  100 mg Oral QHS  . zolpidem  5 mg Oral Once   Continuous Infusions:  LOS: 5 days     HONGALGI,ANAND, MD, FACP, FHM. [facility] Pager [phone]  If 7PM-7AM, please contact night-coverage [email or web address] Password [email or web address] 01/08/2016, 1:45 PM

## 2016-01-08 NOTE — Clinical Social Work Note (Addendum)
Pt is seeking placement in [place] and resides in [place]. [facility] is requesting more information. CSW faxed [facility] this information--awaiting a [facility] number. Pt can not go to a facility without a [facility]. CSW informed with MD, RN, PT, and PT's brother.  [address]Anne [place], [place] [phone]

## 2016-01-08 NOTE — Accreditation Note (Signed)
[facility]: [phone] [facility]    [address] Daft [place], [place] [phone]

## 2016-01-08 NOTE — Discharge Instructions (Signed)
Diarrhea, Adult Diarrhea is frequent loose and watery bowel movements. Diarrhea can make you feel weak and cause you to become dehydrated. Dehydration can make you tired and thirsty, cause you to have a dry mouth, and decrease how often you urinate. Diarrhea typically lasts 2-3 days. However, it can last longer if it is a sign of something more serious. It is important to treat your diarrhea as told by your health care provider. Follow these instructions at home: Eating and drinking Follow these recommendations as told by your health care provider:  Take an oral rehydration solution (ORS). This is a drink that is sold at pharmacies and retail stores.  Drink clear fluids, such as water, ice chips, diluted fruit juice, and low-calorie sports drinks.  Eat bland, easy-to-digest foods in small amounts as you are able. These foods include bananas, applesauce, rice, lean meats, toast, and crackers.  Avoid drinking fluids that contain a lot of sugar or caffeine, such as energy drinks, sports drinks, and soda.  Avoid alcohol.  Avoid spicy or fatty foods. General instructions  Drink enough fluid to keep your urine clear or pale yellow.  Wash your hands often. If soap and water are not available, use hand sanitizer.  Make sure that all people in your household wash their hands well and often.  Take over-the-counter and prescription medicines only as told by your health care provider.  Rest at home while you recover.  Watch your condition for any changes.  Take a warm bath to relieve any burning or pain from frequent diarrhea episodes.  Keep all follow-up visits as told by your health care provider. This is important. Contact a health care provider if:  You have a fever.  Your diarrhea gets worse.  You have new symptoms.  You cannot keep fluids down.  You feel light-headed or dizzy.  You have a headache  You have muscle cramps. Get help right away if:  You have chest  pain.  You feel extremely weak or you faint.  You have bloody or black stools or stools that look like tar.  You have severe pain, cramping, or bloating in your abdomen.  You have trouble breathing or you are breathing very quickly.  Your heart is beating very quickly.  Your skin feels cold and clammy.  You feel confused.  You have signs of dehydration, such as:  Dark urine, very little urine, or no urine.  Cracked lips.  Dry mouth.  Sunken eyes.  Sleepiness.  Weakness. This information is not intended to replace advice given to you by your health care provider. Make sure you discuss any questions you have with your health care provider. Document Released: 01/20/2002 Document Revised: 06/10/2015 Document Reviewed: 10/06/2014 Elsevier Interactive Patient Education  2017 [employer]    Urinary Tract Infection, Adult A urinary tract infection (UTI) is an infection of any part of the urinary tract, which includes the kidneys, ureters, bladder, and urethra. These organs make, store, and get rid of urine in the body. UTI can be a bladder infection (cystitis) or kidney infection (pyelonephritis). What are the causes? This infection may be caused by fungi, viruses, or bacteria. Bacteria are the most common cause of UTIs. This condition can also be caused by repeated incomplete emptying of the bladder during urination. What increases the risk? This condition is more likely to develop if:  You ignore your need to urinate or hold urine for long periods of time.  You do not empty your bladder completely during urination.  You wipe back to front after urinating or having a bowel movement, if you are female.  You are uncircumcised, if you are female.  You are constipated.  You have a urinary catheter that stays in place (indwelling).  You have a weak defense (immune) system.  You have a medical condition that affects your bowels, kidneys, or bladder.  You have  diabetes.  You take antibiotic medicines frequently or for long periods of time, and the antibiotics no longer work well against certain types of infections (antibiotic resistance).  You take medicines that irritate your urinary tract.  You are exposed to chemicals that irritate your urinary tract.  You are female. What are the signs or symptoms? Symptoms of this condition include:  Fever.  Frequent urination or passing small amounts of urine frequently.  Needing to urinate urgently.  Pain or burning with urination.  Urine that smells bad or unusual.  Cloudy urine.  Pain in the lower abdomen or back.  Trouble urinating.  Blood in the urine.  Vomiting or being less hungry than normal.  Diarrhea or abdominal pain.  Vaginal discharge, if you are female. How is this diagnosed? This condition is diagnosed with a medical history and physical exam. You will also need to provide a urine sample to test your urine. Other tests may be done, including:  Blood tests.  Sexually transmitted disease (STD) testing. If you have had more than one UTI, a cystoscopy or imaging studies may be done to determine the cause of the infections. How is this treated? Treatment for this condition often includes a combination of two or more of the following:  Antibiotic medicine.  Other medicines to treat less common causes of UTI.  Over-the-counter medicines to treat pain.  Drinking enough water to stay hydrated. Follow these instructions at home:  Take over-the-counter and prescription medicines only as told by your health care provider.  If you were prescribed an antibiotic, take it as told by your health care provider. Do not stop taking the antibiotic even if you start to feel better.  Avoid alcohol, caffeine, tea, and carbonated beverages. They can irritate your bladder.  Drink enough fluid to keep your urine clear or pale yellow.  Keep all follow-up visits as told by your health  care provider. This is important.  Make sure to:  Empty your bladder often and completely. Do not hold urine for long periods of time.  Empty your bladder before and after sex.  Wipe from front to back after a bowel movement if you are female. Use each tissue one time when you wipe. Contact a health care provider if:  You have back pain.  You have a fever.  You feel nauseous or vomit.  Your symptoms do not get better after 3 days.  Your symptoms go away and then return. Get help right away if:  You have severe back pain or lower abdominal pain.  You are vomiting and cannot keep down any medicines or water. This information is not intended to replace advice given to you by your health care provider. Make sure you discuss any questions you have with your health care provider. Document Released: 11/09/2004 Document Revised: 07/14/2015 Document Reviewed: 12/21/2014 Elsevier Interactive Patient Education  2017 [employer]

## 2016-01-09 ENCOUNTER — Inpatient Hospital Stay (HOSPITAL_COMMUNITY): Payer: Medicare Other

## 2016-01-09 DIAGNOSIS — N3000 Acute cystitis without hematuria: Secondary | ICD-10-CM

## 2016-01-09 LAB — COMPREHENSIVE METABOLIC PANEL
ALBUMIN: 3.3 g/dL — AB (ref 3.5–5.0)
ALT: 58 U/L — AB (ref 14–54)
AST: 45 U/L — AB (ref 15–41)
Alkaline Phosphatase: 46 U/L (ref 38–126)
Anion gap: 9 (ref 5–15)
BUN: 17 mg/dL (ref 6–20)
CHLORIDE: 103 mmol/L (ref 101–111)
CO2: 26 mmol/L (ref 22–32)
Calcium: 9.2 mg/dL (ref 8.9–10.3)
Creatinine, Ser: 0.98 mg/dL (ref 0.44–1.00)
GFR calc Af Amer: >60 mL/min (ref >60)
GFR, EST NON AFRICAN AMERICAN: 57 mL/min — AB (ref >60)
GLUCOSE: 193 mg/dL — AB (ref 65–99)
POTASSIUM: 3.7 mmol/L (ref 3.5–5.1)
SODIUM: 138 mmol/L (ref 135–145)
Total Bilirubin: 0.5 mg/dL (ref 0.3–1.2)
Total Protein: 6.6 g/dL (ref 6.5–8.1)

## 2016-01-09 LAB — GLUCOSE, CAPILLARY
GLUCOSE-CAPILLARY: 187 mg/dL — AB (ref 65–99)
Glucose-Capillary: 109 mg/dL — ABNORMAL HIGH (ref 65–99)

## 2016-01-09 MED ORDER — INSULIN ASPART 100 UNIT/ML ~~LOC~~ SOLN: 0.0000 [IU] | Freq: Every day | SUBCUTANEOUS | Status: DC

## 2016-01-09 MED ORDER — INSULIN ASPART 100 UNIT/ML ~~LOC~~ SOLN
0.0000 [IU] | Freq: Three times a day (TID) | SUBCUTANEOUS | Status: DC
  Administered 2016-01-09: 3 [IU] via SUBCUTANEOUS

## 2016-01-09 MED ORDER — SACCHAROMYCES BOULARDII 250 MG PO CAPS: 250.0000 mg | ORAL_CAPSULE | Freq: Two times a day (BID) | ORAL | Status: AC

## 2016-01-09 NOTE — Discharge Summary (Signed)
Physician Discharge Summary  Anne HusbandsLinda Fancher RUE:[id number] DOB: 1945-08-14  PCP: No PCP Per Patient  Admit date: 01/02/2016 Discharge date: 01/09/2016  Recommendations for Outpatient Follow-up:  1. M.D. at SNF in 3 days with repeat labs (CBC & BMP). Recommend outpatient GI consultation (fatty liver on abdominal ultrasound with mildly abnormal LFTs . Also she has never had a colonoscopy). 2. Dr. Rollene RotundaJames Hochrein, Cardiology: MDs office will arrange for outpatient stress test and follow-up. I contacted cardiology service on 01/08/16. 3. Dr. Johnny BridgeMartha Cole/PCP, upon discharge from SNF. As per patient, PCP is arranging outpatient endocrinology consultation.  Home Health: None Equipment/Devices: None    Discharge Condition: Improved and stable  CODE STATUS: Full  Diet recommendation: Heart healthy and diabetic diet.  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 She presented with weakness and had a UA suggestive of UTI (with symptoms) and associated elevated troponin. Patient gave history of eating seafood at a restaurant ([employer]) and several hours later started having diarrhea without abdominal pain, nausea or vomiting. Denies other family with same symptoms. Cardiology does not think that she has ACS and recommend outpatient Myoview.   Assessment & Plan:   Generalized weakness Likely secondary to acute medical illness including diarrhea, associated dehydration, mild acute kidney injury and rhabdomyolysis complicating pre-existing deconditioning. Clinically improved.  PT and OT evaluated and recommend SNF. Low index of suspicion for PMR or other rheumatic etiologies. Echo shows normal EF. As per discussion with clinical social worker today, she is able to go to SNF today for short-term rehabilitation.  Chest discomfort/elevated troponin Cardiology consultation and follow-up appreciated. Inconsistent history. Cardiology does not suspect ACS and recommend outpatient Lexiscan Myoview because of her risk factors. Chest pain resolved. CTA chest: No PE. Cardiology signed off. Contacted cardiology 11/25 to [place] [place] [place]  Hypertension Uncontrolled. Lisinopril which was held on admission secondary to possible AKI was subsequently resumed, metoprolol was continued. Due to persistent high blood pressure, lisinopril was increased from 10 MG to 20 MG daily on 11/23 and HCTZ was added by cardiology. Blood pressures remained elevated. Added amlodipine 5 MG daily. Better  Diabetes mellitus, type 2 - A1C: 10 suggesting very poor outpatient control. Uncontrolled. Continue Lantus per prior home dose. Added NovoLog mealtime and SSI. Monitor closely and titrate as outpatient as needed. Resume metformin at discharge. Patient states that her PCP is assisting her with outpatient endocrinology consultation which is appropriate.  Diarrhea C. Difficile and GI pathogen panel negative. Patient states that her diarrhea started after eating out at a seafood restaurant. Suspicious for food poisoning versus acute viral GE.  Diarrhea has improved but apparently still having 2-3 soft stools per day. Started probiotics at discharge. Patient indicates that she has never had a colonoscopy and cannot tell why. Recommend outpatient GI consultation for screening colonoscopy and evaluation of fatty liver found on abdominal ultrasound.  UTI Possibly contributing to presentation - Completed 3 doses of IV Rocephin and completed course. Discontinued Rocephin  .  AKI Resolved.  Mild rhabdomyolysis - Possibly from laying in the bathtub for several hours. Presented  with CK of 1154. Improved with CK 274.  Anemia - Stable. Outpatient follow-up.  Abdominal distention, chronic - As per discussion with patient on several occasions and from discussion with son 11/25, patient has chronically protuberant abdomen and she denies any recent worsening. In fact she states that her abdominal distention is better than at home. She denies abdominal pain, nausea or vomiting. KUB 11/25 shows no acute findings and nonobstructive bowel gas pattern. Clinical examination suggests gaseous distention and not ascites. Ultrasound abdomen shows no acute abnormality.    Consultants:   Cardiology-signed off  Procedures:   2-D echo 01/04/16: Study Conclusions  - Left ventricle: The cavity size was normal. Systolic function was normal. The estimated ejection fraction was in the range of 55% to 60%. Wall motion was normal; there were no regional wall motion abnormalities. Doppler parameters are consistent with abnormal left ventricular relaxation (grade 1 diastolic dysfunction). - Mitral valve: Calcified annulus. - Left atrium: The atrium was mildly dilated.  Impressions:  - Technically difficult; definity used; normal LV systolic function; grade 1 diastolic dysfunction; mild LAE.   Discharge Instructions  Discharge Instructions    Call MD for:    Complete by:  As directed    Recurrent or worsening diarrhea.   Call MD for:  difficulty breathing, headache or visual disturbances    Complete by:  As directed    Call MD for:  extreme fatigue    Complete by:  As directed    Call MD for:  persistant dizziness or light-headedness    Complete by:  As directed    Call MD for:  persistant nausea and vomiting    Complete by:  As directed    Call MD for:  severe uncontrolled pain    Complete by:  As directed    Call MD for:  temperature  >100.4    Complete by:  As directed    Diet - low sodium heart healthy    Complete by:  As directed    Diet Carb Modified    Complete by:  As directed    Increase activity slowly    Complete by:  As directed        Medication List    TAKE these medications   amLODipine 5 MG tablet Commonly known as:  NORVASC Take 1 tablet (5 mg total) by mouth daily.   gabapentin 100 MG capsule Commonly known as:  NEURONTIN Take 100 mg by mouth at bedtime.   hydrochlorothiazide 25 MG tablet Commonly known as:  HYDRODIURIL Take 1 tablet (25 mg total) by mouth daily.   insulin aspart 100 UNIT/ML injection Commonly known as:  novoLOG Inject 0-15 Units into the skin 3 (three) times daily with meals. CBG < 70: implement hypoglycemia protocol CBG 70 - 120: 0 units CBG 121 - 150: 2 units CBG 151 - 200: 3 units CBG 201 - 250: 5 units CBG 251 - 300: 8 units CBG 301 - 350: 11 units CBG 351 - 400: 15 units CBG > 400: call MD.   insulin aspart 100 UNIT/ML injection Commonly known as:  novoLOG Inject 6 Units into the skin 3 (three) times daily with meals.   LANTUS 100 UNIT/ML injection Generic drug:  insulin glargine Inject 50 Units into the skin 2 (two) times daily.   lisinopril 10 MG tablet Commonly known as:  PRINIVIL,ZESTRIL Take 2 tablets (20 mg total) by mouth daily. What changed:  how much to take   loperamide 2 MG capsule Commonly known  as:  IMODIUM Take 1 capsule (2 mg total) by mouth 2 (two) times daily as needed for diarrhea or loose stools.   loratadine 10 MG tablet Commonly known as:  CLARITIN Take 10 mg by mouth daily.   metFORMIN 1000 MG tablet Commonly known as:  GLUCOPHAGE Take 1,000 mg by mouth 2 (two) times daily.   metoprolol succinate 100 MG 24 hr tablet Commonly known as:  TOPROL-XL Take 100 mg by mouth at bedtime.   saccharomyces boulardii 250 MG capsule Commonly known as:  FLORASTOR Take 1 capsule (250 mg total) by mouth 2 (two) times daily.       Contact  information for follow-up providers    M.D. at SNF. Schedule an appointment as soon as possible for a visit in 3 day(s).   Why:  To be seen with repeat labs (CBC & BMP).       Rollene Rotunda, MD Follow up.   Specialty:  Cardiology Why:  MDs office will arrange for outpatient heart stress test and follow-up appointment. Contact information: [address] [place] [address] [phone]        Dr. Johnny Bridge Cole/family physician. Schedule an appointment as soon as possible for a visit.   Why:  Follow-up after discharge from SNF.           Contact information for after-discharge care    Destination    [facility] Follow up.   Specialty:  Skilled Nursing Facility Contact information: [address] [place] [address] [phone]                 Allergies  Allergen Reactions  . Acetaminophen Other (See Comments)    epitaxis   . Penicillins Other (See Comments)    Stomach problems     Procedures/Studies: Ct Angio Chest Pe W Or Wo Contrast  Result Date: 01/03/2016 CLINICAL DATA:  70 year old female with chest pain and tachycardia. Initial encounter. EXAM: CT ANGIOGRAPHY CHEST WITH CONTRAST TECHNIQUE: Multidetector CT imaging of the chest was performed using the standard protocol during bolus administration of intravenous contrast. Multiplanar CT image reconstructions and MIPs were obtained to evaluate the vascular anatomy. CONTRAST:  80 mL Isovue 370 COMPARISON:  Portable chest radiograph 0714 hours today. FINDINGS: Cardiovascular: Adequate contrast bolus timing in the pulmonary arterial tree. Mild respiratory motion at the lung bases. No focal filling defect identified in the pulmonary arteries to suggest acute pulmonary embolism. Mild cardiomegaly. No pericardial effusion. Mild Calcified aortic atherosclerosis. Calcified coronary artery atherosclerosis. Mediastinum/Nodes: No lymphadenopathy. Calcified thyroid nodules  incidentally noted at the thoracic inlet, do not appear to meet size criteria for ultrasound follow-up. Lungs/Pleura: Major airways are patent. Somewhat low lung volumes with dependent atelectasis in both lungs. Mild scarring or atelectasis in the lingula. No other abnormal pulmonary opacity. No pleural effusions. Upper Abdomen: Negative visualized liver, spleen, pancreas, adrenal glands, and stomach in the upper abdomen. Musculoskeletal: No acute osseous abnormality identified. Review of the MIP images confirms the above findings. IMPRESSION: 1.  No evidence of acute pulmonary embolus. 2. Mild atelectasis, no other pulmonary abnormality. 3. Calcified aortic and coronary artery atherosclerosis. Electronically Signed   By: Odessa Fleming M.D.   On: 01/03/2016 15:13   US Abdomen Complete  Result Date: 01/09/2016 CLINICAL DATA:  Abdominal distension EXAM: ABDOMEN ULTRASOUND COMPLETE COMPARISON:  None. FINDINGS: Gallbladder: Surgically removed Common bile duct: Diameter: 7.7 mm Liver: Increased in echogenicity consistent with fatty infiltration. IVC: No abnormality visualized. Pancreas: Visualized portion unremarkable.  Spleen: Size and appearance within normal limits. Right Kidney: Length: 10.5 cm. Echogenicity within normal limits. No mass or hydronephrosis visualized. Left Kidney: Length: 11.2 cm. Echogenicity within normal limits. No mass or hydronephrosis visualized. Abdominal aorta: No aneurysm visualized. Other findings: None. IMPRESSION: Status post cholecystectomy.  No acute abnormality is noted. Electronically Signed   By: Alcide Clever M.D.   On: 01/09/2016 10:08   Dg Chest Port 1 View  Result Date: 01/03/2016 CLINICAL DATA:  Recent fall, soreness, initial encounter. EXAM: PORTABLE CHEST 1 VIEW COMPARISON:  None. FINDINGS: Trachea is midline. Heart size is accentuated by low lung volumes. Thoracic aorta is calcified. Lungs are clear. No pleural fluid. Osseous structures are grossly intact. IMPRESSION: Low  lung volumes.  No acute findings. Electronically Signed   By: Leanna Battles M.D.   On: 01/03/2016 07:51   Dg Abd Portable 1v  Result Date: 01/08/2016 CLINICAL DATA:  Abdominal distension, fever, nausea and diarrhea x1 week. Hx of DM, UTI. EXAM: PORTABLE ABDOMEN - 1 VIEW COMPARISON:  None. FINDINGS: Overall bowel gas pattern is nonobstructive. No evidence of soft tissue mass or abnormal fluid collection. No evidence of free intraperitoneal air. Cholecystectomy clips noted in the right upper quadrant. Lung bases are clear. No acute or suspicious osseous finding. IMPRESSION: No acute findings.  Nonobstructive bowel gas pattern. Electronically Signed   By: Bary Richard M.D.   On: 01/08/2016 11:57      Subjective: States that her strength is gradually improving. 2-3 soft BMs daily. No abdominal pain, nausea or vomiting. As per RN, no acute issues.  Discharge Exam:  Vitals:   01/08/16 1900 01/08/16 2112 01/09/16 0601 01/09/16 0900  BP:  133/68 (!) 110/56 (!) 151/64  Pulse:  99 88 84  Resp:  18 16   Temp:  98 F (36.7 C) 98.2 F (36.8 C)   TempSrc:  Oral Oral   SpO2:  99% 97%   Weight: 82.5 kg (181 lb 14.1 oz)  84.9 kg (187 lb 2.7 oz)   Height:        General exam: Appears calm and comfortable . Lying comfortably supine in bed. Respiratory system: Clear to auscultation. Respiratory effort normal. Cardiovascular system: S1 & S2 heard, RRR. No murmurs, rubs, gallops or clicks. Gastrointestinal system: Abdomen is obese/Protuberant, soft and nontender. Tympanitic to percussion. No clinical ascites. Normal bowel sounds heard. Central nervous system: Alert and oriented. No focal neurological deficits. Extremities: No edema. No calf tenderness Skin: No cyanosis. No rashes Psychiatry: Judgement and insight appear impaired. Mood & affect appropriate.     The results of significant diagnostics from this hospitalization (including imaging, microbiology, ancillary and laboratory) are listed  below for reference.     Microbiology: Recent Results (from the past 240 hour(s))  Gastrointestinal Panel by PCR , Stool     Status: None   Collection Time: 01/03/16 12:15 AM  Result Value Ref Range Status   Campylobacter species NOT DETECTED NOT DETECTED Final   Plesimonas shigelloides NOT DETECTED NOT DETECTED Final   Salmonella species NOT DETECTED NOT DETECTED Final   Yersinia enterocolitica NOT DETECTED NOT DETECTED Final   Vibrio species NOT DETECTED NOT DETECTED Final   Vibrio cholerae NOT DETECTED NOT DETECTED Final   Enteroaggregative E coli (EAEC) NOT DETECTED NOT DETECTED Final   Enteropathogenic E coli (EPEC) NOT DETECTED NOT DETECTED Final   Enterotoxigenic E coli (ETEC) NOT DETECTED NOT DETECTED Final   Shiga like toxin producing E coli (STEC) NOT DETECTED NOT DETECTED Final  Shigella/Enteroinvasive E coli (EIEC) NOT DETECTED NOT DETECTED Final   Cryptosporidium NOT DETECTED NOT DETECTED Final   Cyclospora cayetanensis NOT DETECTED NOT DETECTED Final   Entamoeba histolytica NOT DETECTED NOT DETECTED Final   Giardia lamblia NOT DETECTED NOT DETECTED Final   Adenovirus F40/41 NOT DETECTED NOT DETECTED Final   Astrovirus NOT DETECTED NOT DETECTED Final   Norovirus GI/GII NOT DETECTED NOT DETECTED Final   Rotavirus A NOT DETECTED NOT DETECTED Final   Sapovirus (I, II, IV, and V) NOT DETECTED NOT DETECTED Final  C difficile quick scan w PCR reflex     Status: None   Collection Time: 01/04/16  6:27 AM  Result Value Ref Range Status   C Diff antigen NEGATIVE NEGATIVE Final   C Diff toxin NEGATIVE NEGATIVE Final   C Diff interpretation No C. difficile detected.  Final     Labs: BNP (last 3 results) No results for input(s): BNP in the last 8760 hours. Basic Metabolic Panel:  Recent Labs Lab 01/02/16 1727 01/03/16 0707 01/09/16 0445  NA 137 136 138  K 4.0 3.6 3.7  CL 99* 103 103  CO2 24 23 26   GLUCOSE 422* 349* 193*  BUN 23* 16 17  CREATININE 1.13* 0.89 0.98   CALCIUM 9.5 8.4* 9.2   Liver Function Tests:  Recent Labs Lab 01/02/16 1727 01/03/16 0707 01/09/16 0445  AST 61* 47* 45*  ALT 69* 50 58*  ALKPHOS 59 47 46  BILITOT 0.8 0.6 0.5  PROT 8.1 6.1* 6.6  ALBUMIN 4.0 3.2* 3.3*    Recent Labs Lab 01/02/16 1727  LIPASE 21   No results for input(s): AMMONIA in the last 168 hours. CBC:  Recent Labs Lab 01/02/16 1727 01/03/16 0707 01/04/16 0859  WBC 19.9* 16.1* 12.6*  NEUTROABS  --  10.9*  --   HGB 12.9 11.0* 11.1*  HCT 39.0 34.6* 34.8*  MCV 81.8 82.4 83.1  PLT 283 217 217   Cardiac Enzymes:  Recent Labs Lab 01/03/16 0044 01/03/16 0707 01/03/16 1157 01/05/16 0716  CKTOTAL  --  1,154*  --  274*  TROPONINI 0.09* 0.10* 0.05*  --    BNP: Invalid input(s): POCBNP CBG:  Recent Labs Lab 01/08/16 1203 01/08/16 1614 01/08/16 2111 01/09/16 0640 01/09/16 1220  GLUCAP 223* 257* 216* 187* 109*   Urinalysis    Component Value Date/Time   COLORURINE YELLOW 01/02/2016 1931   APPEARANCEUR CLOUDY (A) 01/02/2016 1931   LABSPEC 1.031 (H) 01/02/2016 1931   PHURINE 5.0 01/02/2016 1931   GLUCOSEU >1000 (A) 01/02/2016 1931   HGBUR SMALL (A) 01/02/2016 1931   BILIRUBINUR NEGATIVE 01/02/2016 1931   KETONESUR 15 (A) 01/02/2016 1931   PROTEINUR 30 (A) 01/02/2016 1931   NITRITE POSITIVE (A) 01/02/2016 1931   LEUKOCYTESUR SMALL (A) 01/02/2016 1931      Time coordinating discharge: Over 30 minutes  SIGNED:  Marcellus Scott, MD, FACP, FHM. [facility] Pager [phone]  If 7PM-7AM, please contact night-coverage [email or web address] Password [facility] 01/09/2016, 12:51 PM

## 2016-01-09 NOTE — Progress Notes (Signed)
[facility] LPN at [facility]. Pt iV d/c without complication, pt is comfort to be discharge.

## 2016-01-09 NOTE — Clinical Social Work Placement (Signed)
Medical Social Worker facilitated patient discharge including contacting patient family and facility to confirm patient discharge plans.  Clinical information faxed to facility and family agreeable with plan.  MSW arranged ambulance transport via [facility] to [facility].  RN to call report prior to discharge.  Medical Social Worker will sign off for now as social work intervention is no longer needed. Please consult us again if new need arises.   CLINICAL SOCIAL WORK PLACEMENT  NOTE  Date:  01/09/2016  Patient Details  Name: Graciela HusbandsLinda Stayer MRN: [id number] Date of Birth: [birth year]  Clinical Social Work is seeking post-discharge placement for this patient at the Skilled  Nursing Facility level of care (*CSW will initial, date and re-position this form in  chart as items are completed):  Yes   Patient/family provided with [facility] Clinical Social Work Department's list of facilities offering this level of care within the geographic area requested by the patient (or if unable, by the patient's family).  Yes   Patient/family informed of their freedom to choose among providers that offer the needed level of care, that participate in Medicare, Medicaid or managed care program needed by the patient, have an available bed and are willing to accept the patient.  No   Patient/family informed of [facility]'s ownership interest in [facility] and [facility], as well as of the fact that they are under no obligation to receive care at these facilities.  PASRR submitted to [facility] on 01/07/16     PASRR number received on 01/08/16     Existing PASRR number confirmed on       FL2 transmitted to all facilities in geographic area requested by pt/family on       FL2 transmitted to all facilities within larger geographic area on 01/07/16     Patient informed that his/her managed care company has contracts with or will negotiate with certain facilities, including the following:        Yes    Patient/family informed of bed offers received.  Patient [facility] bed at  [facility]     Physician recommends and patient chooses bed at      Patient to be transferred to  [facility] on 01/09/16.  Patient to be transferred to facility by  [facility] )     Patient family notified on 01/09/16 of transfer.  Name of family member notified:   (Pt's son, Montez HagemanJr )     PHYSICIAN Please sign FL2, Please prepare priority discharge summary, including medications     Additional Comment:    _______________________________________________ Derenda FennelNixon, Hollie Wojahn A 01/09/2016, 12:41 PM

## 2016-02-11 ENCOUNTER — Encounter (HOSPITAL_COMMUNITY): Payer: Medicare Other

## 2016-02-11 ENCOUNTER — Encounter (HOSPITAL_COMMUNITY): Admission: RE | Admit: 2016-02-11 | Payer: Medicare Other | Source: Ambulatory Visit

## 2017-05-14 IMAGING — CT CT ANGIO CHEST
2 of 9 series · 18 of 46 positions shown · IV contrast (isovue)
Comparison: Portable chest radiograph 2113 hours today.

CLINICAL DATA: 70-year-old female with chest pain and tachycardia.
Initial encounter.

EXAM:
CT ANGIOGRAPHY CHEST WITH CONTRAST
TECHNIQUE: Multidetector CT imaging of the chest was performed using the
standard protocol during bolus administration of intravenous
contrast. Multiplanar CT image reconstructions and MIPs were
obtained to evaluate the vascular anatomy.
CONTRAST:  80 mL Isovue 370

[Series 6: thins · axial · 0.63mm/px · z∈[-229,-12]mm · 15 of 239 slices shown]
[im 11/239  lung]
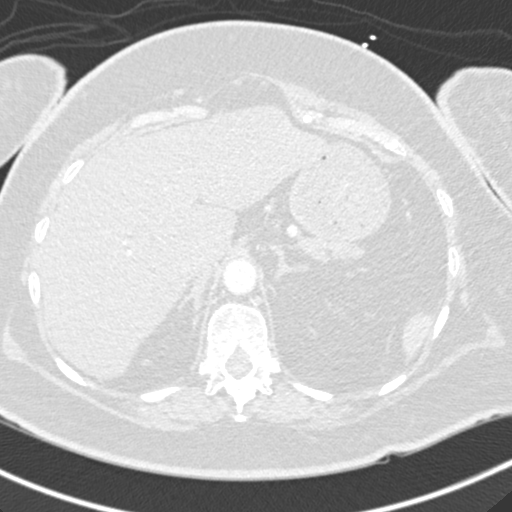
[im 33/239  soft-tissue]
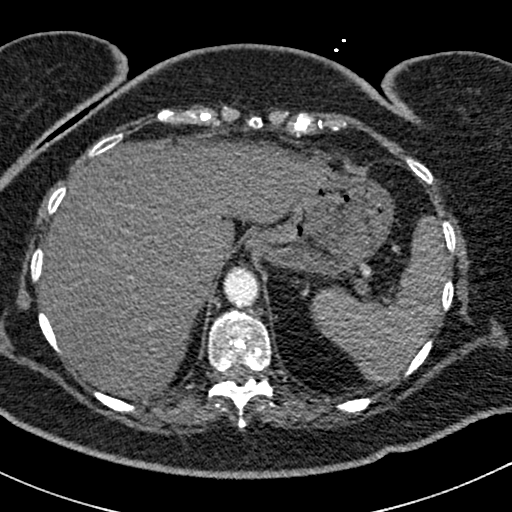
[im 44/239  lung]
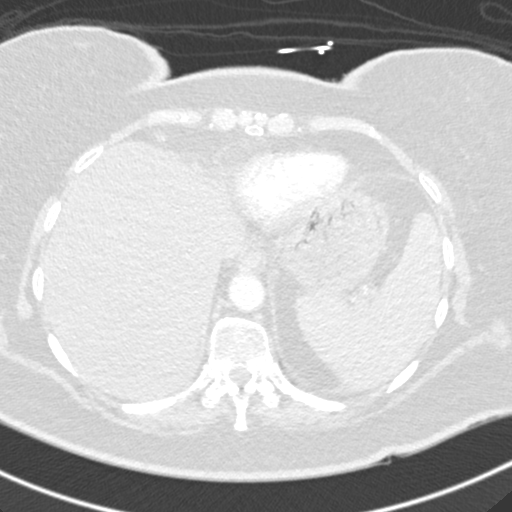
[im 55/239  soft-tissue]
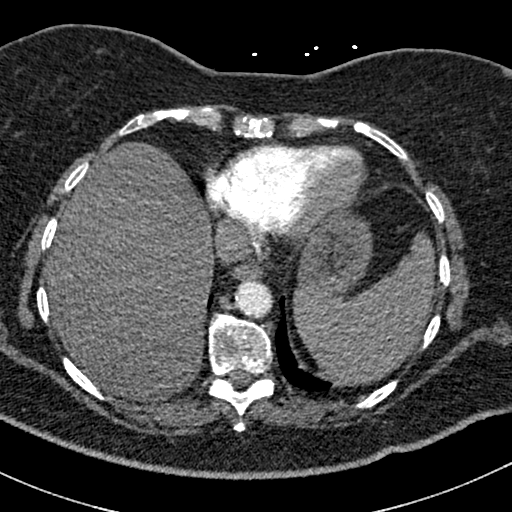
[im 76/239  lung]
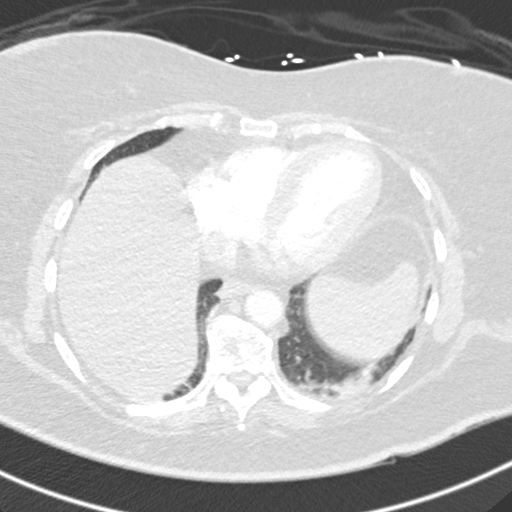
[im 87/239  soft-tissue]
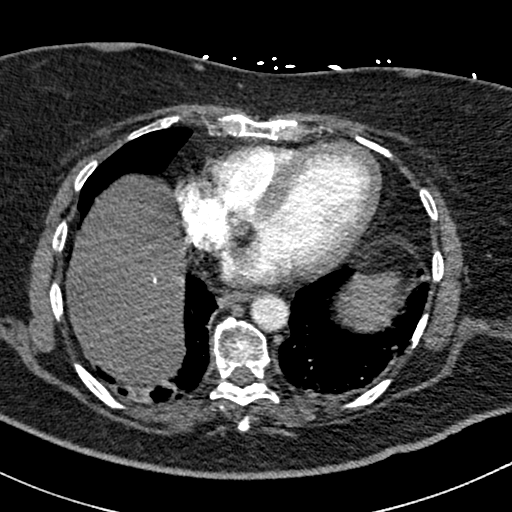
[im 109/239  lung]
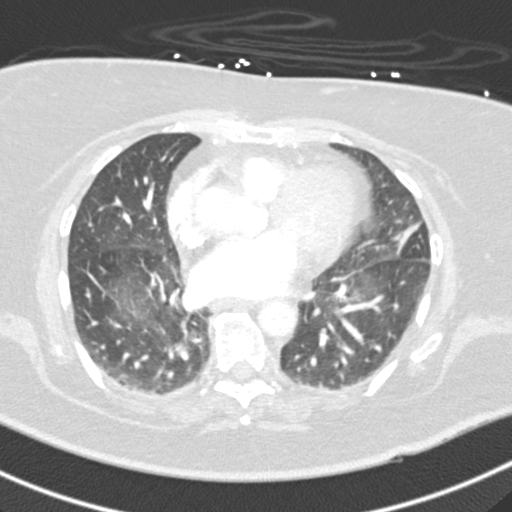
[im 120/239  soft-tissue]
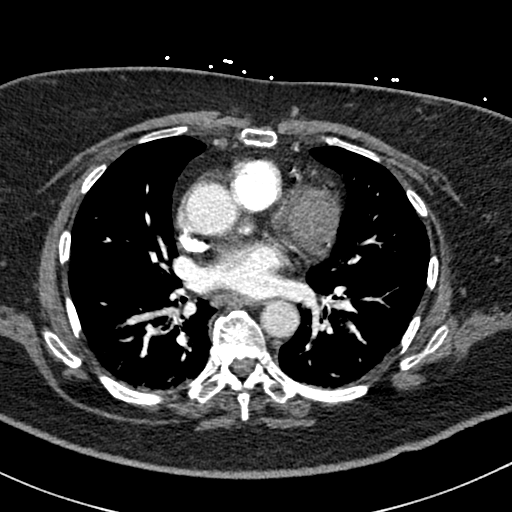
[im 130/239  lung]
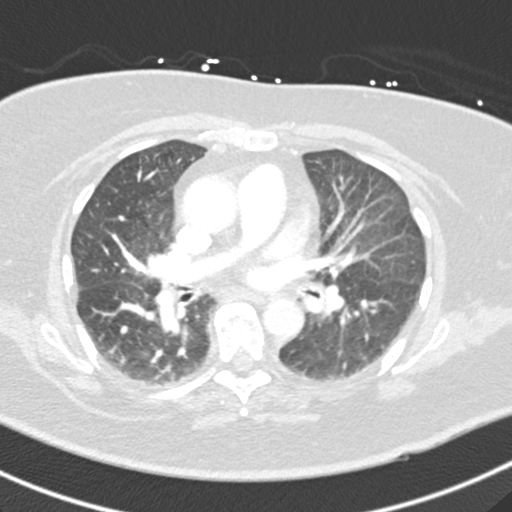
[im 152/239  soft-tissue]
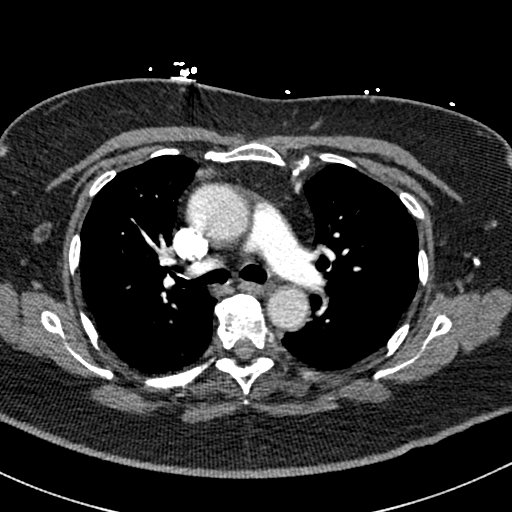
[im 163/239  lung]
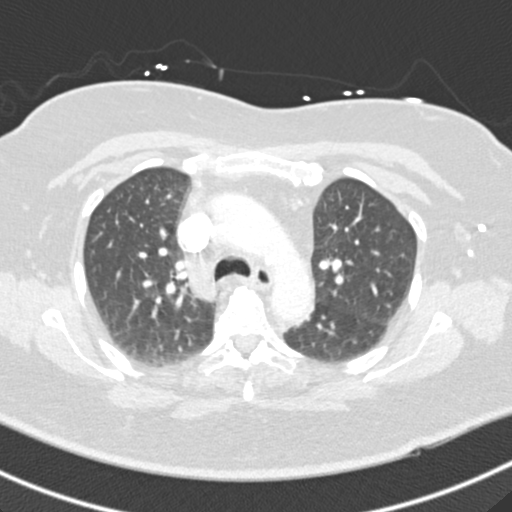
[im 184/239  soft-tissue]
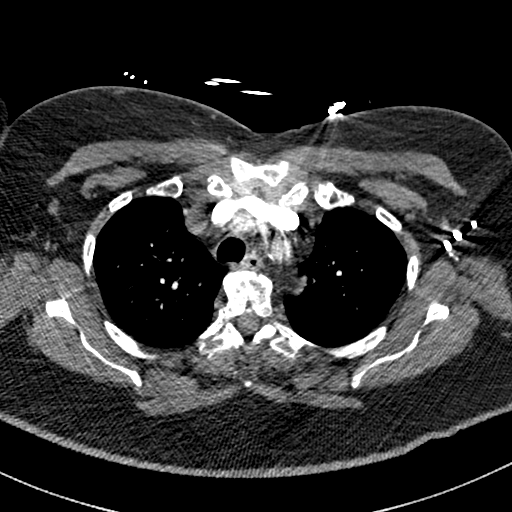
[im 195/239  lung]
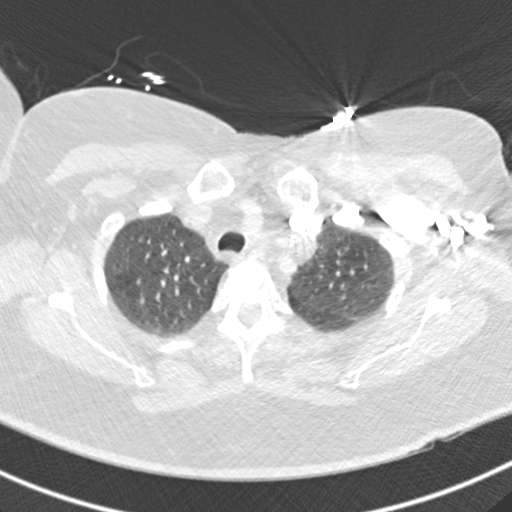
[im 206/239  soft-tissue]
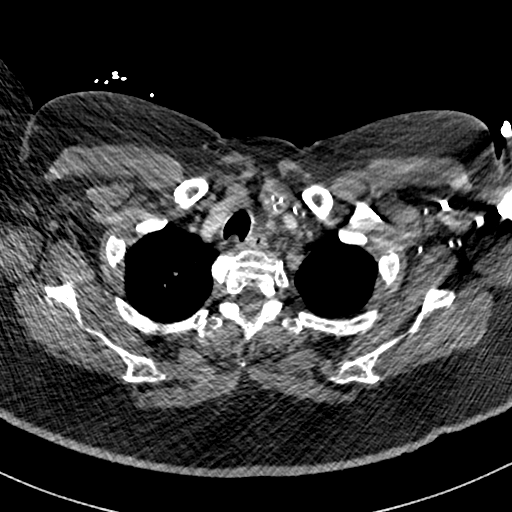
[im 228/239  lung]
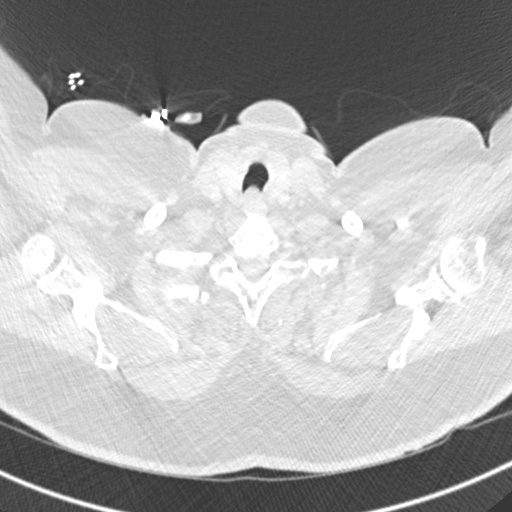

[Series 8: coronal mpr · coronal · 0.49mm/px · 3 of 112 slices shown]
[im 28/112  soft-tissue]
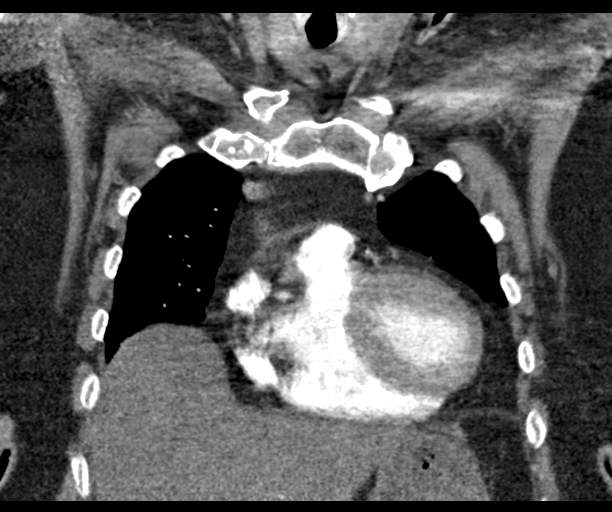
[im 56/112  soft-tissue]
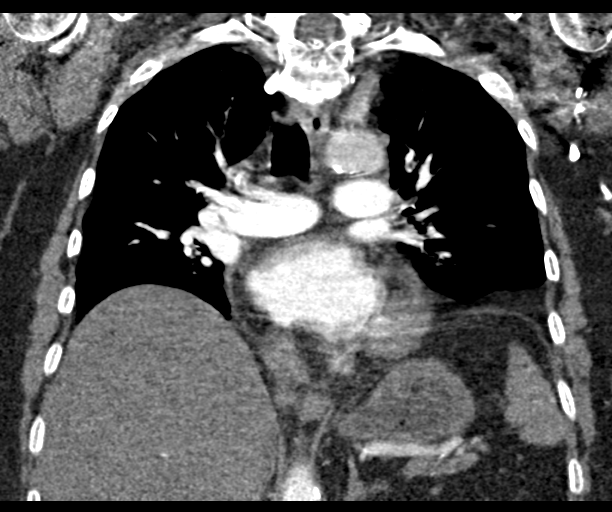
[im 84/112  soft-tissue]
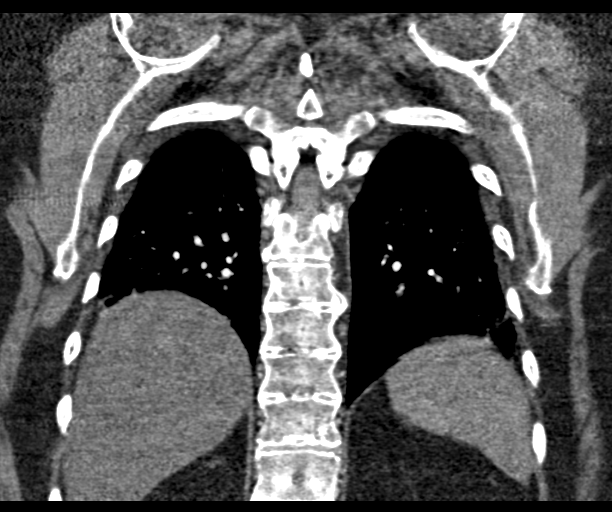

[18 of 46 positions shown; findings below may reference images not displayed]

FINDINGS: Cardiovascular: Adequate contrast bolus timing in the pulmonary
arterial tree. Mild respiratory motion at the lung bases. No focal
filling defect identified in the pulmonary arteries to suggest acute
pulmonary embolism.

Mild cardiomegaly. No pericardial effusion. Mild Calcified aortic
atherosclerosis. Calcified coronary artery atherosclerosis.

Mediastinum/Nodes: No lymphadenopathy.

Calcified thyroid nodules incidentally noted at the thoracic inlet,
do not appear to meet size criteria for ultrasound follow-up.

Lungs/Pleura: Major airways are patent. Somewhat low lung volumes
with dependent atelectasis in both lungs. Mild scarring or
atelectasis in the lingula. No other abnormal pulmonary opacity. No
pleural effusions.

Upper Abdomen: Negative visualized liver, spleen, pancreas, adrenal
glands, and stomach in the upper abdomen.

Musculoskeletal: No acute osseous abnormality identified.

Review of the MIP images confirms the above findings.
IMPRESSION: 1.  No evidence of acute pulmonary embolus.
2. Mild atelectasis, no other pulmonary abnormality.
3. Calcified aortic and coronary artery atherosclerosis.

## 2017-05-19 IMAGING — CR DG ABD PORTABLE 1V
2 series · 2 of 2 positions shown · non-contrast
Comparison: None.

CLINICAL DATA: Abdominal distension, fever, nausea and diarrhea x1
week. Hx of DM, UTI.

EXAM:
PORTABLE ABDOMEN - 1 VIEW

[AP (1 of 2)]
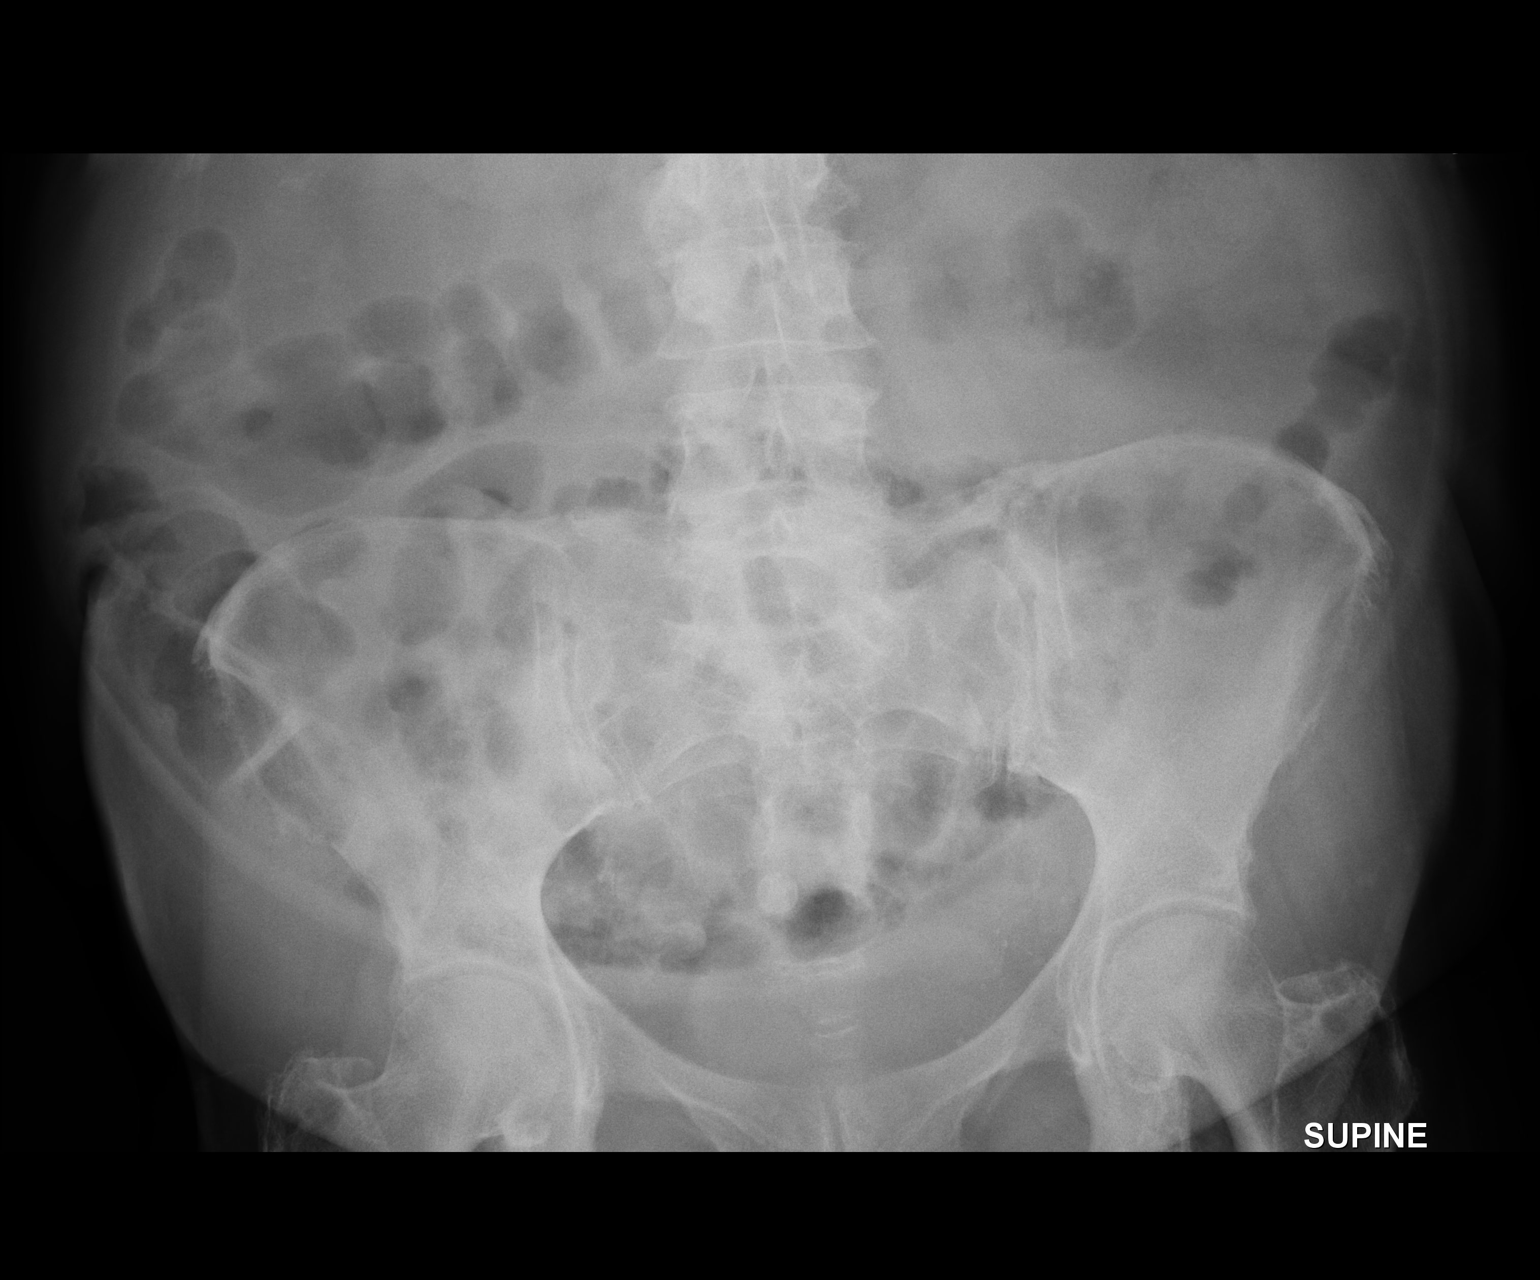

[AP (2 of 2)]
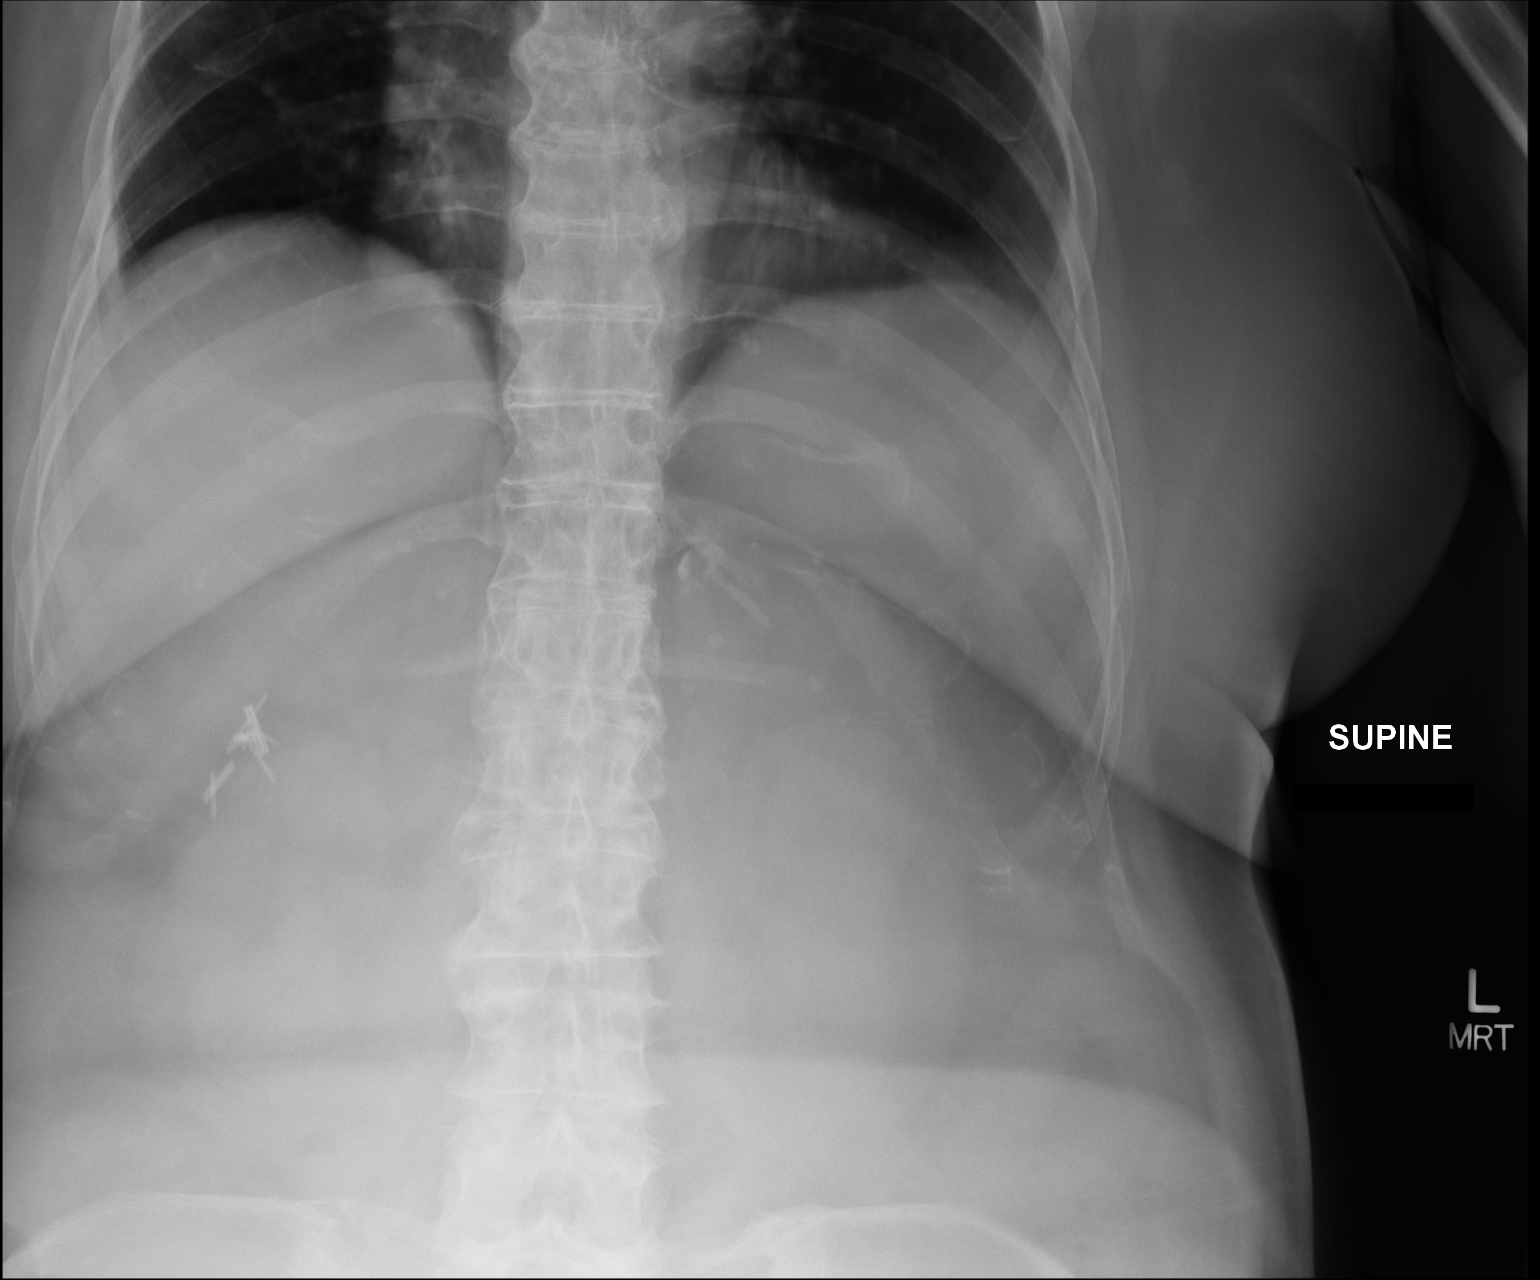

[2 of 2 positions shown; findings below may reference images not displayed]

FINDINGS: Overall bowel gas pattern is nonobstructive. No evidence of soft
tissue mass or abnormal fluid collection. No evidence of free
intraperitoneal air. Cholecystectomy clips noted in the right upper
quadrant. Lung bases are clear. No acute or suspicious osseous
finding.
IMPRESSION: No acute findings.  Nonobstructive bowel gas pattern.

## 2018-06-11 IMAGING — US US ABDOMEN COMPLETE
1 series · 14 of 25 positions shown · non-contrast
Comparison: None.

CLINICAL DATA: Abdominal distension

EXAM:
ABDOMEN ULTRASOUND COMPLETE

[Series 1: us abdomen complete · 0.22mm/px · 14 of 71 slices shown]
[im 1/71]
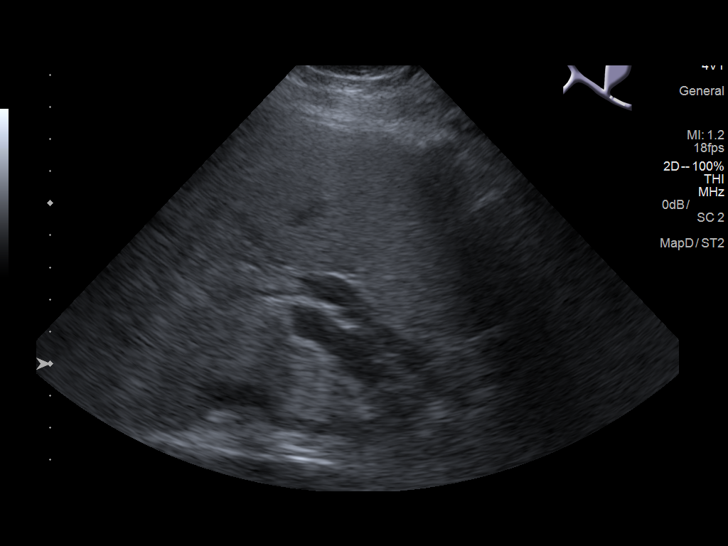
[im 6/71]
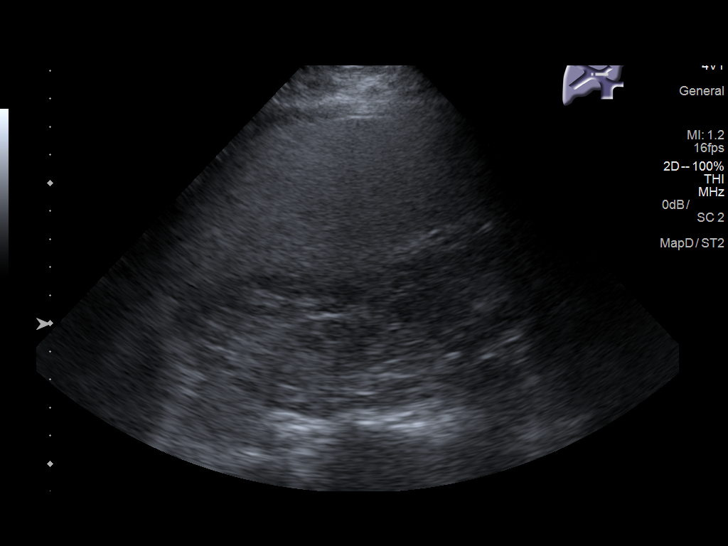
[im 12/71]
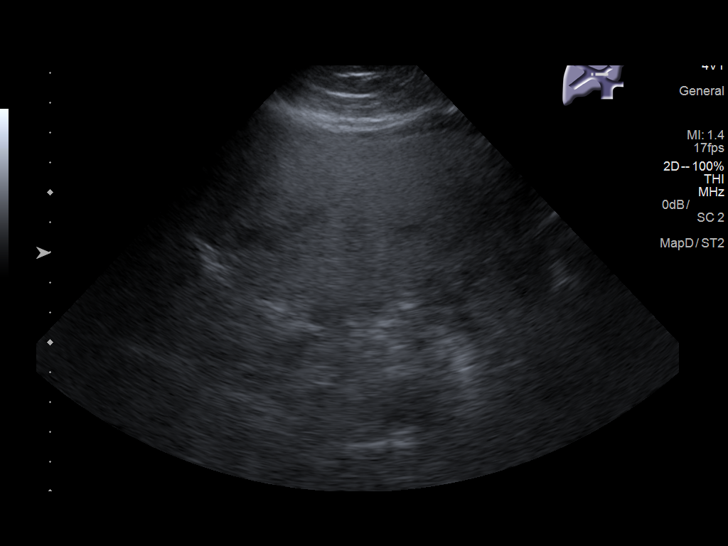
[im 18/71]
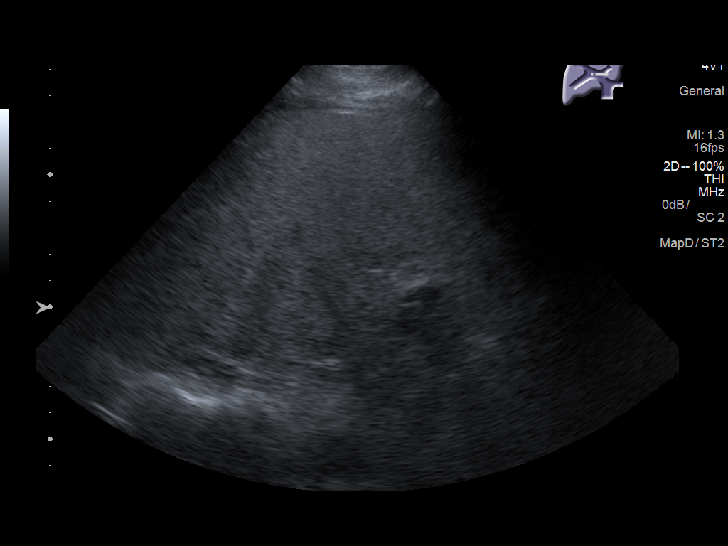
[im 24/71]
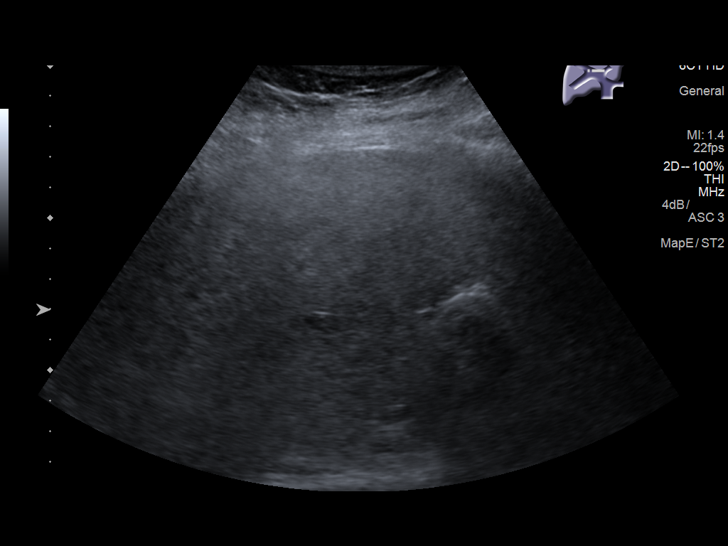
[im 27/71]
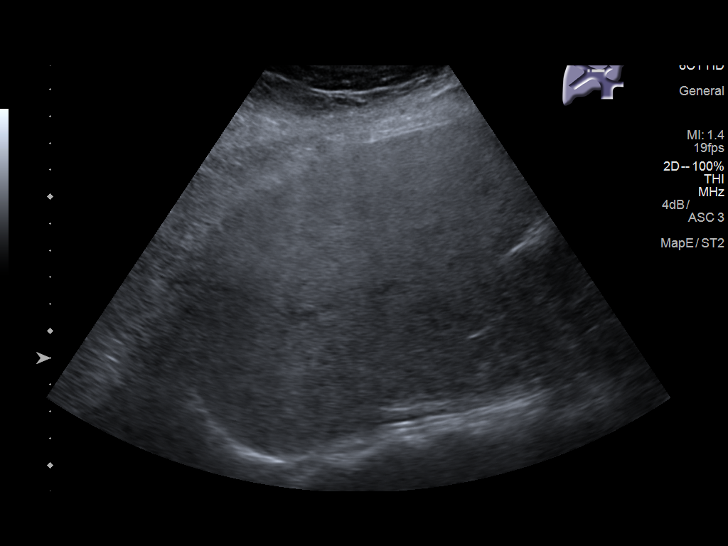
[im 33/71]
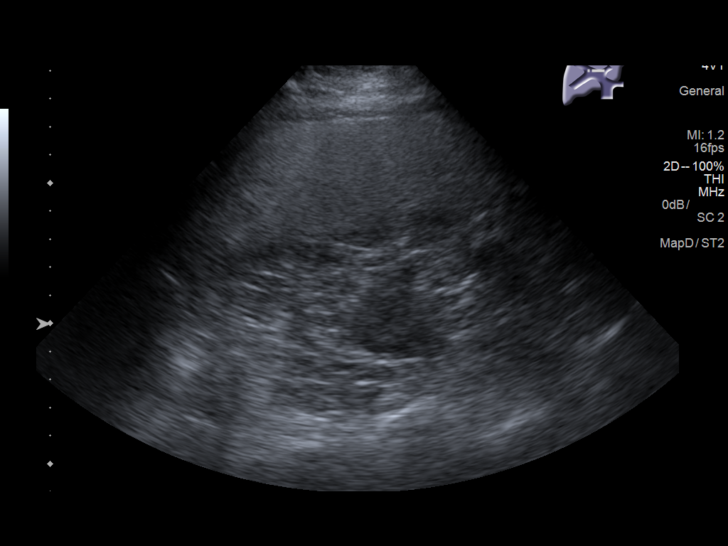
[im 38/71]
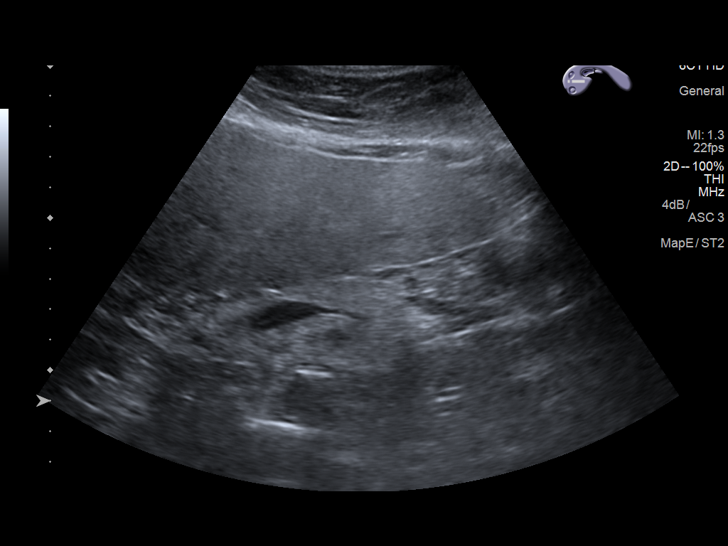
[im 44/71]
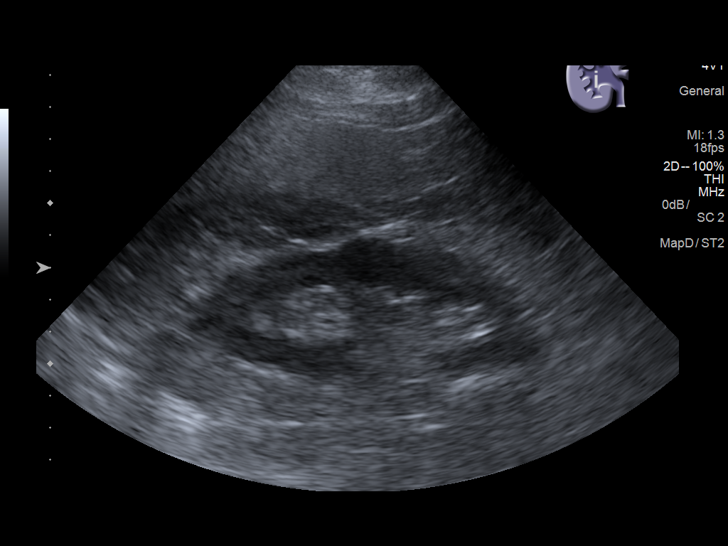
[im 47/71]
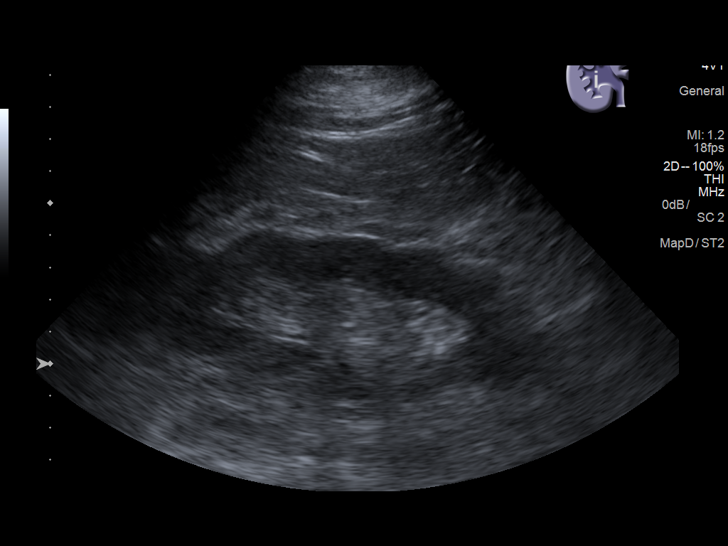
[im 53/71]
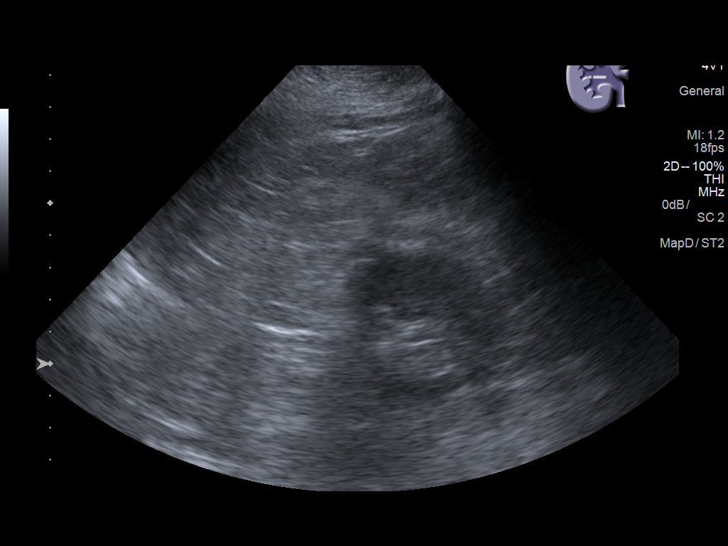
[im 59/71]
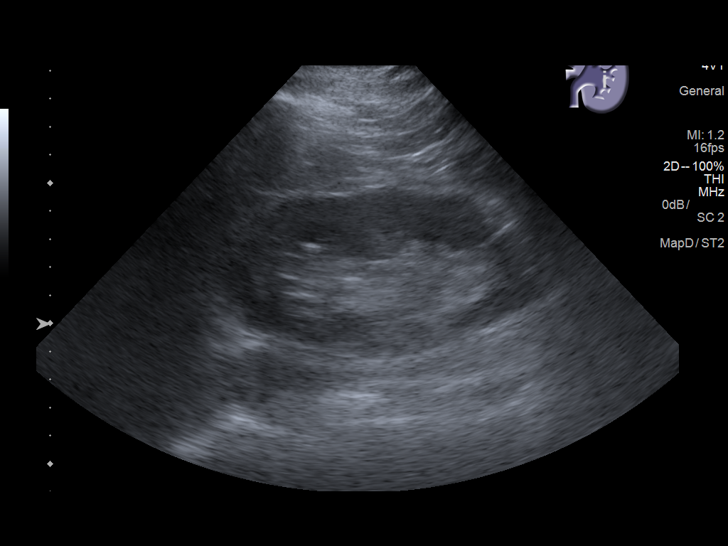
[im 65/71]
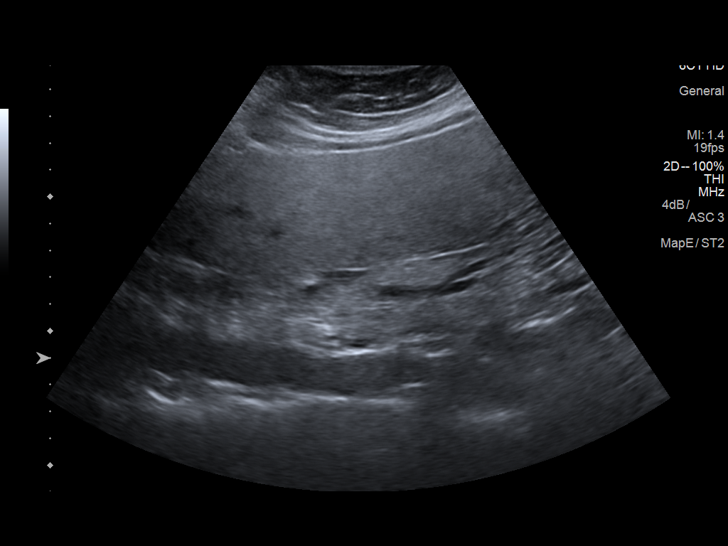
[im 71/71]
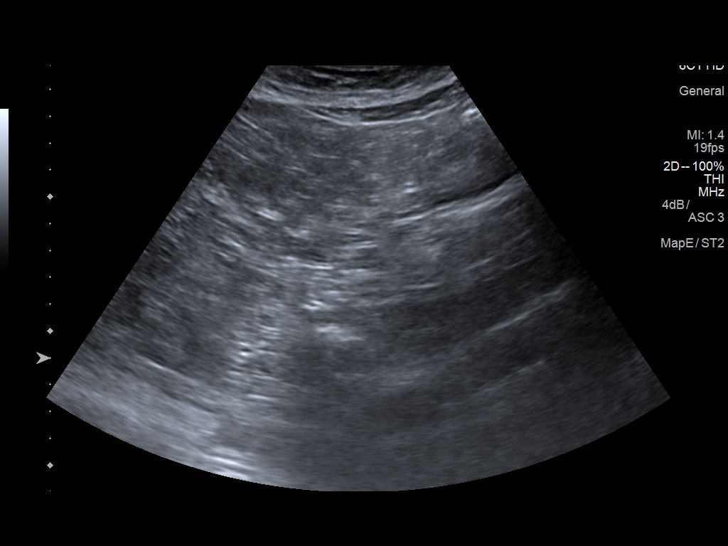

[14 of 25 positions shown; findings below may reference images not displayed]

FINDINGS: Gallbladder: Surgically removed

Common bile duct: Diameter: 7.7 mm

Liver: Increased in echogenicity consistent with fatty infiltration.

IVC: No abnormality visualized.

Pancreas: Visualized portion unremarkable.

Spleen: Size and appearance within normal limits.

Right Kidney: Length: 10.5 cm.. Echogenicity within normal limits.
No mass or hydronephrosis visualized.

Left Kidney: Length: 11.2 cm.. Echogenicity within normal limits. No
mass or hydronephrosis visualized.

Abdominal aorta: No aneurysm visualized.

Other findings: None.
IMPRESSION: Status post cholecystectomy.  No acute abnormality is noted.

## 2020-11-13 DEATH — deceased
